# Patient Record
Sex: Male | Born: 1960 | Hispanic: No | Marital: Single | Smoking: Former smoker
Health system: Southern US, Community
[De-identification: ages and names within clinical notes are randomized; demographics above are authoritative.]

## PROBLEM LIST (undated history)

## (undated) DIAGNOSIS — I82409 Acute embolism and thrombosis of unspecified deep veins of unspecified lower extremity: Secondary | ICD-10-CM

## (undated) DIAGNOSIS — C259 Malignant neoplasm of pancreas, unspecified: Secondary | ICD-10-CM

## (undated) HISTORY — PX: HERNIA REPAIR: SHX51

---

## 2014-09-15 ENCOUNTER — Inpatient Hospital Stay: Payer: Medicare Other

## 2014-09-15 ENCOUNTER — Emergency Department: Payer: Medicare Other

## 2014-09-15 ENCOUNTER — Inpatient Hospital Stay
Admission: EM | Admit: 2014-09-15 | Discharge: 2014-09-25 | DRG: 871 | Disposition: E | Payer: Medicare Other | Attending: Internal Medicine | Admitting: Internal Medicine

## 2014-09-15 DIAGNOSIS — C801 Malignant (primary) neoplasm, unspecified: Secondary | ICD-10-CM

## 2014-09-15 DIAGNOSIS — Z9221 Personal history of antineoplastic chemotherapy: Secondary | ICD-10-CM

## 2014-09-15 DIAGNOSIS — Z880 Allergy status to penicillin: Secondary | ICD-10-CM | POA: Diagnosis not present

## 2014-09-15 DIAGNOSIS — N179 Acute kidney failure, unspecified: Secondary | ICD-10-CM | POA: Diagnosis not present

## 2014-09-15 DIAGNOSIS — E663 Overweight: Secondary | ICD-10-CM | POA: Diagnosis present

## 2014-09-15 DIAGNOSIS — R7989 Other specified abnormal findings of blood chemistry: Secondary | ICD-10-CM | POA: Diagnosis not present

## 2014-09-15 DIAGNOSIS — R945 Abnormal results of liver function studies: Secondary | ICD-10-CM

## 2014-09-15 DIAGNOSIS — Z9889 Other specified postprocedural states: Secondary | ICD-10-CM

## 2014-09-15 DIAGNOSIS — J910 Malignant pleural effusion: Secondary | ICD-10-CM | POA: Diagnosis present

## 2014-09-15 DIAGNOSIS — R451 Restlessness and agitation: Secondary | ICD-10-CM | POA: Diagnosis present

## 2014-09-15 DIAGNOSIS — A419 Sepsis, unspecified organism: Secondary | ICD-10-CM | POA: Diagnosis present

## 2014-09-15 DIAGNOSIS — J9601 Acute respiratory failure with hypoxia: Secondary | ICD-10-CM | POA: Diagnosis present

## 2014-09-15 DIAGNOSIS — Z79891 Long term (current) use of opiate analgesic: Secondary | ICD-10-CM | POA: Diagnosis not present

## 2014-09-15 DIAGNOSIS — R34 Anuria and oliguria: Secondary | ICD-10-CM | POA: Diagnosis present

## 2014-09-15 DIAGNOSIS — Z9911 Dependence on respirator [ventilator] status: Secondary | ICD-10-CM

## 2014-09-15 DIAGNOSIS — C259 Malignant neoplasm of pancreas, unspecified: Secondary | ICD-10-CM | POA: Diagnosis present

## 2014-09-15 DIAGNOSIS — Z86718 Personal history of other venous thrombosis and embolism: Secondary | ICD-10-CM | POA: Diagnosis not present

## 2014-09-15 DIAGNOSIS — J90 Pleural effusion, not elsewhere classified: Secondary | ICD-10-CM | POA: Diagnosis present

## 2014-09-15 DIAGNOSIS — R0602 Shortness of breath: Secondary | ICD-10-CM

## 2014-09-15 DIAGNOSIS — Z66 Do not resuscitate: Secondary | ICD-10-CM | POA: Diagnosis not present

## 2014-09-15 DIAGNOSIS — Z87891 Personal history of nicotine dependence: Secondary | ICD-10-CM | POA: Diagnosis not present

## 2014-09-15 DIAGNOSIS — Z6826 Body mass index (BMI) 26.0-26.9, adult: Secondary | ICD-10-CM

## 2014-09-15 DIAGNOSIS — D696 Thrombocytopenia, unspecified: Secondary | ICD-10-CM | POA: Diagnosis present

## 2014-09-15 DIAGNOSIS — R400 Somnolence: Secondary | ICD-10-CM | POA: Diagnosis not present

## 2014-09-15 DIAGNOSIS — E872 Acidosis: Secondary | ICD-10-CM | POA: Diagnosis present

## 2014-09-15 DIAGNOSIS — Z452 Encounter for adjustment and management of vascular access device: Secondary | ICD-10-CM

## 2014-09-15 DIAGNOSIS — R6521 Severe sepsis with septic shock: Secondary | ICD-10-CM | POA: Diagnosis present

## 2014-09-15 DIAGNOSIS — Y95 Nosocomial condition: Secondary | ICD-10-CM | POA: Diagnosis present

## 2014-09-15 DIAGNOSIS — F329 Major depressive disorder, single episode, unspecified: Secondary | ICD-10-CM | POA: Diagnosis present

## 2014-09-15 DIAGNOSIS — M6289 Other specified disorders of muscle: Secondary | ICD-10-CM | POA: Diagnosis present

## 2014-09-15 DIAGNOSIS — Z7401 Bed confinement status: Secondary | ICD-10-CM | POA: Diagnosis not present

## 2014-09-15 DIAGNOSIS — R0600 Dyspnea, unspecified: Secondary | ICD-10-CM | POA: Insufficient documentation

## 2014-09-15 DIAGNOSIS — R0902 Hypoxemia: Secondary | ICD-10-CM | POA: Diagnosis not present

## 2014-09-15 DIAGNOSIS — J189 Pneumonia, unspecified organism: Secondary | ICD-10-CM | POA: Diagnosis present

## 2014-09-15 DIAGNOSIS — K7200 Acute and subacute hepatic failure without coma: Secondary | ICD-10-CM | POA: Diagnosis present

## 2014-09-15 DIAGNOSIS — N170 Acute kidney failure with tubular necrosis: Secondary | ICD-10-CM | POA: Diagnosis present

## 2014-09-15 DIAGNOSIS — J9811 Atelectasis: Secondary | ICD-10-CM | POA: Diagnosis present

## 2014-09-15 DIAGNOSIS — I469 Cardiac arrest, cause unspecified: Secondary | ICD-10-CM | POA: Diagnosis present

## 2014-09-15 DIAGNOSIS — F419 Anxiety disorder, unspecified: Secondary | ICD-10-CM | POA: Diagnosis present

## 2014-09-15 DIAGNOSIS — E875 Hyperkalemia: Secondary | ICD-10-CM | POA: Diagnosis present

## 2014-09-15 DIAGNOSIS — J9600 Acute respiratory failure, unspecified whether with hypoxia or hypercapnia: Secondary | ICD-10-CM

## 2014-09-15 DIAGNOSIS — J9690 Respiratory failure, unspecified, unspecified whether with hypoxia or hypercapnia: Secondary | ICD-10-CM

## 2014-09-15 DIAGNOSIS — Z79899 Other long term (current) drug therapy: Secondary | ICD-10-CM

## 2014-09-15 HISTORY — DX: Acute embolism and thrombosis of unspecified deep veins of unspecified lower extremity: I82.409

## 2014-09-15 HISTORY — DX: Malignant neoplasm of pancreas, unspecified: C25.9

## 2014-09-15 LAB — BLOOD GAS, ARTERIAL
Acid-base deficit: 6.4 mmol/L — ABNORMAL HIGH (ref 0.0–2.0)
Acid-base deficit: 11 mmol/L — ABNORMAL HIGH (ref 0.0–2.0)
BICARBONATE: 19.9 meq/L — AB (ref 21.0–28.0)
Bicarbonate: 18.2 mEq/L — ABNORMAL LOW (ref 21.0–28.0)
DELIVERY SYSTEMS: POSITIVE
EXPIRATORY PAP: 6
FIO2: 0.35
FIO2: 0.6
Inspiratory PAP: 14
LHR: 12 {breaths}/min
MECHANICAL RATE: 14
MECHANICAL RATE: 12
MECHVT: 450 mL
O2 Saturation: 94.3 %
O2 Saturation: 88.9 %
PATIENT TEMPERATURE: 37
PEEP: 5 cmH2O
PH ART: 7.35 (ref 7.350–7.450)
PO2 ART: 78 mmHg — AB (ref 83.0–108.0)
Patient temperature: 37
pCO2 arterial: 33 mmHg (ref 32.0–48.0)
pCO2 arterial: 67 mmHg — ABNORMAL HIGH (ref 32.0–48.0)
pH, Arterial: 7.08 — CL (ref 7.350–7.450)
pO2, Arterial: 76 mmHg — ABNORMAL LOW (ref 83.0–108.0)

## 2014-09-15 LAB — GLUCOSE, SEROUS FLUID: Glucose, Fluid: 34 mg/dL

## 2014-09-15 LAB — GLUCOSE, CAPILLARY
GLUCOSE-CAPILLARY: 165 mg/dL — AB (ref 65–99)
Glucose-Capillary: 116 mg/dL — ABNORMAL HIGH (ref 65–99)
Glucose-Capillary: 126 mg/dL — ABNORMAL HIGH (ref 65–99)

## 2014-09-15 LAB — PROTIME-INR
INR: 1.63
PROTHROMBIN TIME: 19.5 s — AB (ref 11.4–15.0)

## 2014-09-15 LAB — CBC
HEMATOCRIT: 30.9 % — AB (ref 40.0–52.0)
HEMOGLOBIN: 10 g/dL — AB (ref 13.0–18.0)
MCH: 28.7 pg (ref 26.0–34.0)
MCHC: 32.3 g/dL (ref 32.0–36.0)
MCV: 88.9 fL (ref 80.0–100.0)
Platelets: 365 10*3/uL (ref 150–440)
RBC: 3.48 MIL/uL — ABNORMAL LOW (ref 4.40–5.90)
RDW: 20.3 % — ABNORMAL HIGH (ref 11.5–14.5)
WBC: 43.3 10*3/uL — ABNORMAL HIGH (ref 3.8–10.6)

## 2014-09-15 LAB — COMPREHENSIVE METABOLIC PANEL
ALT: 105 U/L — ABNORMAL HIGH (ref 17–63)
ANION GAP: 11 (ref 5–15)
AST: 270 U/L — ABNORMAL HIGH (ref 15–41)
Albumin: 2.3 g/dL — ABNORMAL LOW (ref 3.5–5.0)
Alkaline Phosphatase: 393 U/L — ABNORMAL HIGH (ref 38–126)
BUN: 60 mg/dL — ABNORMAL HIGH (ref 6–20)
CHLORIDE: 103 mmol/L (ref 101–111)
CO2: 20 mmol/L — AB (ref 22–32)
Calcium: 7.8 mg/dL — ABNORMAL LOW (ref 8.9–10.3)
Creatinine, Ser: 1.61 mg/dL — ABNORMAL HIGH (ref 0.61–1.24)
GFR calc non Af Amer: 47 mL/min — ABNORMAL LOW (ref >60)
GFR, EST AFRICAN AMERICAN: 54 mL/min — AB (ref >60)
Glucose, Bld: 144 mg/dL — ABNORMAL HIGH (ref 65–99)
POTASSIUM: 5.2 mmol/L — AB (ref 3.5–5.1)
SODIUM: 134 mmol/L — AB (ref 135–145)
Total Bilirubin: 3.4 mg/dL — ABNORMAL HIGH (ref 0.3–1.2)
Total Protein: 6.5 g/dL (ref 6.5–8.1)

## 2014-09-15 LAB — BODY FLUID CELL COUNT WITH DIFFERENTIAL
EOS FL: 0 %
Lymphs, Fluid: 6 %
Monocyte-Macrophage-Serous Fluid: 8 %
Neutrophil Count, Fluid: 86 %
OTHER CELLS FL: 0 %
Total Nucleated Cell Count, Fluid: 3827 cu mm

## 2014-09-15 LAB — BASIC METABOLIC PANEL
Anion gap: 14 (ref 5–15)
BUN: 57 mg/dL — AB (ref 6–20)
CHLORIDE: 101 mmol/L (ref 101–111)
CO2: 18 mmol/L — AB (ref 22–32)
CREATININE: 1.68 mg/dL — AB (ref 0.61–1.24)
Calcium: 7.8 mg/dL — ABNORMAL LOW (ref 8.9–10.3)
GFR calc Af Amer: 52 mL/min — ABNORMAL LOW (ref >60)
GFR calc non Af Amer: 45 mL/min — ABNORMAL LOW (ref >60)
Glucose, Bld: 118 mg/dL — ABNORMAL HIGH (ref 65–99)
Potassium: 4.9 mmol/L (ref 3.5–5.1)
Sodium: 133 mmol/L — ABNORMAL LOW (ref 135–145)

## 2014-09-15 LAB — LACTATE DEHYDROGENASE, PLEURAL OR PERITONEAL FLUID: LD FL: 4285 U/L — AB (ref 3–23)

## 2014-09-15 LAB — TRIGLYCERIDES
TRIGLYCERIDES: 299 mg/dL — AB (ref <150)
Triglycerides: 321 mg/dL — ABNORMAL HIGH (ref <150)

## 2014-09-15 LAB — APTT: APTT: 33 s (ref 24–36)

## 2014-09-15 LAB — LACTIC ACID, PLASMA: Lactic Acid, Venous: 3.9 mmol/L (ref 0.5–2.0)

## 2014-09-15 LAB — MRSA PCR SCREENING: MRSA BY PCR: NEGATIVE

## 2014-09-15 LAB — ABO/RH: ABO/RH(D): O POS

## 2014-09-15 LAB — HEMOGLOBIN AND HEMATOCRIT, BLOOD
HEMATOCRIT: 31.6 % — AB (ref 40.0–52.0)
Hemoglobin: 9.9 g/dL — ABNORMAL LOW (ref 13.0–18.0)

## 2014-09-15 LAB — PROTEIN, BODY FLUID: TOTAL PROTEIN, FLUID: 4 g/dL

## 2014-09-15 LAB — PREPARE RBC (CROSSMATCH)

## 2014-09-15 MED ORDER — MIDAZOLAM HCL 2 MG/2ML IJ SOLN
4.0000 mg | Freq: Once | INTRAMUSCULAR | Status: AC
  Administered 2014-09-15: 4 mg via INTRAVENOUS

## 2014-09-15 MED ORDER — ACETAMINOPHEN 650 MG RE SUPP: 650.0000 mg | Freq: Four times a day (QID) | RECTAL | Status: DC | PRN

## 2014-09-15 MED ORDER — SODIUM CHLORIDE 0.9 % IV BOLUS (SEPSIS)
1000.0000 mL | Freq: Once | INTRAVENOUS | Status: AC
  Administered 2014-09-15: 1000 mL via INTRAVENOUS

## 2014-09-15 MED ORDER — OXYCODONE HCL 5 MG PO TABS: 15.0000 mg | ORAL_TABLET | ORAL | Status: DC | PRN

## 2014-09-15 MED ORDER — LEVOFLOXACIN IN D5W 750 MG/150ML IV SOLN
750.0000 mg | Freq: Once | INTRAVENOUS | Status: AC
  Administered 2014-09-15: 750 mg via INTRAVENOUS
  Filled 2014-09-15: qty 150

## 2014-09-15 MED ORDER — LORAZEPAM 2 MG PO TABS: 2.0000 mg | ORAL_TABLET | Freq: Three times a day (TID) | ORAL | Status: DC

## 2014-09-15 MED ORDER — SODIUM CHLORIDE 0.9 % IV SOLN
Freq: Once | INTRAVENOUS | Status: AC
  Administered 2014-09-15: 06:00:00 via INTRAVENOUS

## 2014-09-15 MED ORDER — PROPOFOL 1000 MG/100ML IV EMUL
5.0000 ug/kg/min | INTRAVENOUS | Status: DC
  Administered 2014-09-15: 12 ug/kg/min via INTRAVENOUS
  Administered 2014-09-15: 5 ug/kg/min via INTRAVENOUS
  Administered 2014-09-16: 20 ug/kg/min via INTRAVENOUS
  Administered 2014-09-17: 25 ug/kg/min via INTRAVENOUS
  Filled 2014-09-15 (×4): qty 100

## 2014-09-15 MED ORDER — LORAZEPAM 2 MG/ML IJ SOLN
2.0000 mg | Freq: Three times a day (TID) | INTRAMUSCULAR | Status: DC | PRN
  Administered 2014-09-15: 2 mg via INTRAVENOUS
  Filled 2014-09-15: qty 1

## 2014-09-15 MED ORDER — IPRATROPIUM-ALBUTEROL 0.5-2.5 (3) MG/3ML IN SOLN
3.0000 mL | Freq: Four times a day (QID) | RESPIRATORY_TRACT | Status: AC
  Administered 2014-09-15 – 2014-09-17 (×8): 3 mL via RESPIRATORY_TRACT
  Filled 2014-09-15 (×8): qty 3

## 2014-09-15 MED ORDER — SENNOSIDES 8.8 MG/5ML PO SYRP
5.0000 mL | ORAL_SOLUTION | Freq: Two times a day (BID) | ORAL | Status: DC | PRN
  Filled 2014-09-15: qty 5

## 2014-09-15 MED ORDER — FENTANYL CITRATE (PF) 100 MCG/2ML IJ SOLN: 300.0000 ug | Freq: Once | INTRAMUSCULAR | Status: AC

## 2014-09-15 MED ORDER — SODIUM CHLORIDE 0.9 % IV SOLN
INTRAVENOUS | Status: AC
  Administered 2014-09-15: 12:00:00 via INTRAVENOUS

## 2014-09-15 MED ORDER — ONDANSETRON HCL 4 MG/2ML IJ SOLN: 4.0000 mg | Freq: Four times a day (QID) | INTRAMUSCULAR | Status: DC | PRN

## 2014-09-15 MED ORDER — PROPOFOL 1000 MG/100ML IV EMUL
INTRAVENOUS | Status: AC
  Administered 2014-09-15: 5 ug/kg/min via INTRAVENOUS
  Filled 2014-09-15: qty 100

## 2014-09-15 MED ORDER — SERTRALINE HCL 100 MG PO TABS
200.0000 mg | ORAL_TABLET | Freq: Every day | ORAL | Status: DC
  Administered 2014-09-17: 200 mg via ORAL
  Filled 2014-09-15: qty 2

## 2014-09-15 MED ORDER — ACETAMINOPHEN 325 MG PO TABS: 650.0000 mg | ORAL_TABLET | Freq: Four times a day (QID) | ORAL | Status: DC | PRN

## 2014-09-15 MED ORDER — FENTANYL CITRATE (PF) 100 MCG/2ML IJ SOLN
100.0000 ug | INTRAMUSCULAR | Status: DC | PRN
  Administered 2014-09-16 (×3): 100 ug via INTRAVENOUS
  Filled 2014-09-15 (×3): qty 2

## 2014-09-15 MED ORDER — GABAPENTIN 300 MG PO CAPS
300.0000 mg | ORAL_CAPSULE | Freq: Three times a day (TID) | ORAL | Status: DC
  Administered 2014-09-15: 300 mg via ORAL
  Filled 2014-09-15 (×2): qty 1

## 2014-09-15 MED ORDER — FENTANYL CITRATE (PF) 100 MCG/2ML IJ SOLN
100.0000 ug | INTRAMUSCULAR | Status: AC | PRN
  Administered 2014-09-15 (×3): 100 ug via INTRAVENOUS

## 2014-09-15 MED ORDER — ONDANSETRON HCL 4 MG PO TABS: 4.0000 mg | ORAL_TABLET | Freq: Four times a day (QID) | ORAL | Status: DC | PRN

## 2014-09-15 MED ORDER — PANTOPRAZOLE SODIUM 40 MG PO TBEC
40.0000 mg | DELAYED_RELEASE_TABLET | Freq: Every day | ORAL | Status: DC
Start: 2014-09-15 — End: 2014-09-16
  Filled 2014-09-15: qty 1

## 2014-09-15 MED ORDER — VECURONIUM BROMIDE 10 MG IV SOLR
INTRAVENOUS | Status: AC
  Administered 2014-09-15: 10 mg via INTRAVENOUS
  Filled 2014-09-15: qty 10

## 2014-09-15 MED ORDER — LEVOFLOXACIN IN D5W 750 MG/150ML IV SOLN
750.0000 mg | INTRAVENOUS | Status: DC
  Filled 2014-09-15 (×2): qty 150

## 2014-09-15 MED ORDER — VITAL HIGH PROTEIN PO LIQD
1000.0000 mL | ORAL | Status: DC
  Administered 2014-09-15: 1000 mL

## 2014-09-15 MED ORDER — FENTANYL CITRATE (PF) 100 MCG/2ML IJ SOLN
INTRAMUSCULAR | Status: AC
  Administered 2014-09-15: 100 ug via INTRAVENOUS
  Filled 2014-09-15: qty 4

## 2014-09-15 MED ORDER — VANCOMYCIN HCL IN DEXTROSE 1-5 GM/200ML-% IV SOLN
1000.0000 mg | Freq: Once | INTRAVENOUS | Status: AC
  Administered 2014-09-15: 1000 mg via INTRAVENOUS
  Filled 2014-09-15: qty 200

## 2014-09-15 MED ORDER — FENTANYL CITRATE (PF) 100 MCG/2ML IJ SOLN
INTRAMUSCULAR | Status: AC
  Administered 2014-09-15: 100 ug via INTRAVENOUS
  Filled 2014-09-15: qty 2

## 2014-09-15 MED ORDER — FREE WATER
25.0000 mL | Status: DC
  Administered 2014-09-15 – 2014-09-17 (×8): 25 mL

## 2014-09-15 MED ORDER — HEPARIN SODIUM (PORCINE) 5000 UNIT/ML IJ SOLN
5000.0000 [IU] | Freq: Three times a day (TID) | INTRAMUSCULAR | Status: DC
  Administered 2014-09-15 – 2014-09-16 (×2): 5000 [IU] via SUBCUTANEOUS
  Filled 2014-09-15 (×3): qty 1

## 2014-09-15 MED ORDER — VANCOMYCIN HCL IN DEXTROSE 1-5 GM/200ML-% IV SOLN
1000.0000 mg | Freq: Two times a day (BID) | INTRAVENOUS | Status: DC
  Administered 2014-09-15 (×2): 1000 mg via INTRAVENOUS
  Filled 2014-09-15 (×5): qty 200

## 2014-09-15 MED ORDER — MORPHINE SULFATE (PF) 4 MG/ML IV SOLN
4.0000 mg | Freq: Once | INTRAVENOUS | Status: AC
  Administered 2014-09-15: 4 mg via INTRAVENOUS
  Filled 2014-09-15: qty 1

## 2014-09-15 MED ORDER — MIDAZOLAM HCL 2 MG/2ML IJ SOLN
INTRAMUSCULAR | Status: AC
  Administered 2014-09-15: 4 mg via INTRAVENOUS
  Filled 2014-09-15: qty 4

## 2014-09-15 MED ORDER — SENNA 8.6 MG PO TABS: 1.0000 | ORAL_TABLET | Freq: Two times a day (BID) | ORAL | Status: DC | PRN

## 2014-09-15 MED ORDER — VECURONIUM BROMIDE 10 MG IV SOLR
10.0000 mg | Freq: Once | INTRAVENOUS | Status: AC
  Administered 2014-09-15: 10 mg via INTRAVENOUS

## 2014-09-15 MED ORDER — SODIUM CHLORIDE 0.9 % IJ SOLN
3.0000 mL | Freq: Two times a day (BID) | INTRAMUSCULAR | Status: DC
  Administered 2014-09-15 – 2014-09-17 (×5): 3 mL via INTRAVENOUS

## 2014-09-15 NOTE — Progress Notes (Signed)
Update Beside U/S by Radiology showing blood appearance, 1.5L removed.  CTS consulted.  Vilinda Boehringer, MD [facility] Pager [phone] (Please enter 7-digits)

## 2014-09-15 NOTE — Procedures (Signed)
Procedure Note:  Orotracheal Intubation  Implied consent due to emergent nature of patient's condition. Correct Patient, Name & ID confirmed.  The patient was pre-oxygenated and then, under direct visualization, a 8.0 mm cuffed endotracheal tube was placed through the vocal cords into the trachea, using the Glidescope.  Total attempts made 1, using Glidescope.  Thick white secretions noted in posterior pharynx During intubation an assistant applied gentle pressure to the cricoid cartilage.  Position confirmed by auscultation of lungs (good breath sounds bilaterally) and no stomach sounds.  Tube secured at 25 cm. Pulse ox 98 %. CO2 detector in place with appropriate color change.   Pt tolerated procedure well.  No complications were noted.   CXR ordered.   Vilinda Boehringer, MD [facility] Pager [phone] On Call Pager [phone]

## 2014-09-15 NOTE — Progress Notes (Signed)
Oral subglottic suction noted at 146mmhg

## 2014-09-15 NOTE — Progress Notes (Signed)
Patient ID: Steven Pineda, male   DOB: 08/16/1960, 54 y.o.   MRN: [id number]  Chief Complaint  Patient presents with  . Shortness of Breath    Referred By Dr. Rhea Belton Reason for Referral right pleural effusion  HPI Location, Quality, Duration, Severity, Timing, Context, Modifying Factors, Associated Signs and Symptoms.  Steven Pineda is a 54 y.o. male.  I have personally seen and examined this patient. I discussed his care with Dr. Edwin Dada as well as Dr. Truman Hayward at the [facility]. I have independently reviewed his chest x-rays.  The history is obtained from multiple sources including the patient's to answer who I spoke to over the telephone. This patient is a 54 year old gentleman with a history of adenocarcinoma of the pancreas diagnosed approximately 2 years ago. He is undergone chemotherapy at [facility] and recently was admitted to that institution for fever and altered mental status. Blood cultures were negative and the patient ultimately left AGAINST MEDICAL ADVICE. This was about one week prior to admission. I'm told at that time he had a normal chest x-ray. He presented here with increasing shortness of breath and had a chest x-ray showing a very large right pleural effusion. He underwent intubation and subsequent thoracentesis. The thoracentesis was positive for some bloody fluid. Of note is that the patient is currently on Lovenox according to Dr. Truman Hayward. He states that he was being managed for a DVT although that information is still unclear. Dr. Truman Hayward did state that several days ago the patient had a normal-appearing chest x-ray. In any event I've been asked to see the patient for management of his right pleural effusion. I did discuss his care with the patient's 2 aunts who are his primary caregivers. They would like to have a family meeting tomorrow and I will arrange for that.   Past Medical History  Diagnosis Date  . DVT (deep venous thrombosis)   .  Pancreatic cancer     Past Surgical History  Procedure Laterality Date  . Hernia repair      History reviewed. No pertinent family history.  Social History Social History  Substance Use Topics  . Smoking status: Former Research scientist (life sciences)  . Smokeless tobacco: Never Used  . Alcohol Use: None    Allergies  Allergen Reactions  . Penicillins Hives    Current Facility-Administered Medications  Medication Dose Route Frequency Provider Last Rate Last Dose  . 0.9 %  sodium chloride infusion   Intravenous Continuous Juluis Mire, MD 60 mL/hr at 09/05/2014 1200    . acetaminophen (TYLENOL) tablet 650 mg  650 mg Oral Q6H PRN Juluis Mire, MD       Or  . acetaminophen (TYLENOL) suppository 650 mg  650 mg Rectal Q6H PRN Juluis Mire, MD      . feeding supplement (VITAL HIGH PROTEIN) liquid 1,000 mL  1,000 mL Per Tube Q24H Vishal Mungal, MD      . fentaNYL (SUBLIMAZE) injection 100 mcg  100 mcg Intravenous Q2H PRN Vishal Mungal, MD      . free water 25 mL  25 mL Per Tube 6 times per day Vilinda Boehringer, MD      . gabapentin (NEURONTIN) capsule 300 mg  300 mg Oral TID Juluis Mire, MD   300 mg at 08/31/2014 1339  . heparin injection 5,000 Units  5,000 Units Subcutaneous 3 times per day Juluis Mire, MD   5,000 Units at 09/13/2014 1349  . ipratropium-albuterol (DUONEB) 0.5-2.5 (3)  MG/3ML nebulizer solution 3 mL  3 mL Nebulization Q6H Vishal Mungal, MD   3 mL at 09/02/2014 1400  . [START ON 09/16/2014] levofloxacin (LEVAQUIN) IVPB 750 mg  750 mg Intravenous Q24H Epifanio Lesches, MD      . ondansetron (ZOFRAN) tablet 4 mg  4 mg Oral Q6H PRN Juluis Mire, MD       Or  . ondansetron [facility]) injection 4 mg  4 mg Intravenous Q6H PRN Juluis Mire, MD      . oxyCODONE (Oxy IR/ROXICODONE) immediate release tablet 15 mg  15 mg Oral Q4H PRN Juluis Mire, MD      . pantoprazole (PROTONIX) EC tablet 40 mg  40 mg Oral Daily Juluis Mire, MD   40 mg at 09/18/2014 1337  . propofol (DIPRIVAN)  1000 MG/100ML infusion  5-70 mcg/kg/min Intravenous Titrated Vishal Mungal, MD 7.5 mL/hr at 08/25/2014 1500 15 mcg/kg/min at 08/30/2014 1500  . senna (SENOKOT) tablet 8.6 mg  1 tablet Oral BID PRN Epifanio Lesches, MD      . sertraline (ZOLOFT) tablet 200 mg  200 mg Oral Daily Juluis Mire, MD   200 mg at 09/11/2014 1339  . sodium chloride 0.9 % injection 3 mL  3 mL Intravenous Q12H Juluis Mire, MD   3 mL at 09/04/2014 1340  . vancomycin (VANCOCIN) IVPB 1000 mg/200 mL premix  1,000 mg Intravenous Q12H Epifanio Lesches, MD   1,000 mg at 09/19/2014 1348      Review of Systems A complete review of systems was asked and was negative except for the following positive findings.  A review of systems could not be obtained because the patient is currently intubated.  Blood pressure 105/69, pulse 110, temperature 97.7 F (36.5 C), temperature source Axillary, resp. rate 27, height 5\' 10"  (1.778 m), weight 84.7 kg (186 lb 11.7 oz), SpO2 93 %.  Physical Exam CONSTITUTIONAL:  Pleasant, well-developed, well-nourished.  He is currently intubated. EYES: Pupils equal and reactive to light, Sclera non-icteric EARS, NOSE, MOUTH AND THROAT:  The oropharynx was clear.  Oral mucosa pink and moist. LYMPH NODES:  Lymph nodes in the neck and axillae were normal RESPIRATORY:  Lungs were clear on the left and diminished on the right..  Normal respiratory effort without pathologic use of accessory muscles of respiration  CARDIOVASCULAR: Heart was regular without murmurs.  There were no carotid bruits. GI: The abdomen was soft, nontender, and nondistended. There were no palpable masses. There was no hepatosplenomegaly. There were normal bowel sounds in all quadrants.  There were no obvious scars present. GU:  Rectal deferred.   MUSCULOSKELETAL:  Normal muscle strength and tone.  No clubbing or cyanosis.   SKIN:  There were no pathologic skin lesions.  There were no nodules on palpation. NEUROLOGIC:  Sensation is  normal.  Cranial nerves are grossly intact. PSYCH:  Oriented to person, place and time.  Mood and affect are normal.  Data Reviewed I have reviewed the patient's chest x-rays.  I have personally reviewed the patient's imaging, laboratory findings and medical records.    Assessment    I believe this patient most likely has a malignant pleural effusion although with a normal chest x-ray from several days ago it would appear that this is perhaps less likely. The patient is on Lovenox and it's unclear whether or not there may have been any local trauma. His post procedure chest x-ray shows a marked emanation in the size of the pleural effusion. However  there still remains a moderate sized pleural effusion but there is no evidence of pneumothorax.    Plan    At the present time I will await the results of the cytology on the pleural fluid. I will meet with the family tomorrow. My current recommendation is to proceed with a thoracoscopic talc pleurodesis and a Pleurx catheter placement. If the patient can get extubated they are willing to except him at [facility] for ongoing oncologic care.   Nestor Lewandowsky, MD 09/16/2014, 4:36 PM

## 2014-09-15 NOTE — Consult Note (Signed)
 [place]  [phone] [phone]  ID: Steven Pineda OB: Jul 22, 1960  MR#: [id number]  ZTI#:[id number]  No care team member to display  CHIEF COMPLAINT:  Chief Complaint  Patient presents with  . Shortness of Breath    INTERVAL HISTORY: Patient is a 54 year old gentleman with reported history of adenocarcinoma the pancreas. He has been undergoing chemotherapy at [facility], it is unclear when his last treatment was. He recently was admitted to [facility] with altered mental status and fever but subsequently left AMA. He presented to [facility] with increasing shortness of breath and a new right pleural effusion. He was intubated and sedated and underwent thoracentesis to assess whether his pleural effusion is malignant. No family is at bedside. Review of systems is unobtainable.   REVIEW OF SYSTEMS:   Review of Systems  Unable to perform ROS: intubated    As per HPI. Otherwise, a complete review of systems is negatve.  PAST MEDICAL HISTORY: Past Medical History  Diagnosis Date  . DVT (deep venous thrombosis)   . Pancreatic cancer     PAST SURGICAL HISTORY: Past Surgical History  Procedure Laterality Date  . Hernia repair      FAMILY HISTORY History reviewed. No pertinent family history.     ADVANCED DIRECTIVES:    HEALTH MAINTENANCE: Social History  Substance Use Topics  . Smoking status: Former Research scientist (life sciences)  . Smokeless tobacco: Never Used  . Alcohol Use: None     Colonoscopy:  PAP:  Bone density:  Lipid panel:  Allergies  Allergen Reactions  . Penicillins Hives    Current Facility-Administered Medications  Medication Dose Route Frequency Provider Last Rate Last Dose  . 0.9 %  sodium chloride infusion   Intravenous Continuous Juluis Mire, MD 60 mL/hr at 09/20/2014 1200    . acetaminophen (TYLENOL) tablet 650 mg  650 mg Oral Q6H PRN Juluis Mire, MD       Or  . acetaminophen (TYLENOL) suppository 650 mg  650 mg Rectal Q6H  PRN Juluis Mire, MD      . feeding supplement (VITAL HIGH PROTEIN) liquid 1,000 mL  1,000 mL Per Tube Q24H Vishal Mungal, MD      . fentaNYL (SUBLIMAZE) injection 100 mcg  100 mcg Intravenous Q2H PRN Vishal Mungal, MD      . free water 25 mL  25 mL Per Tube 6 times per day Vilinda Boehringer, MD      . gabapentin (NEURONTIN) capsule 300 mg  300 mg Oral TID Juluis Mire, MD   300 mg at 09/19/2014 1339  . heparin injection 5,000 Units  5,000 Units Subcutaneous 3 times per day Juluis Mire, MD   5,000 Units at 09/11/2014 1349  . ipratropium-albuterol (DUONEB) 0.5-2.5 (3) MG/3ML nebulizer solution 3 mL  3 mL Nebulization Q6H Vishal Mungal, MD   3 mL at 09/12/2014 1400  . [START ON 09/16/2014] levofloxacin (LEVAQUIN) IVPB 750 mg  750 mg Intravenous Q24H Epifanio Lesches, MD      . ondansetron (ZOFRAN) tablet 4 mg  4 mg Oral Q6H PRN Juluis Mire, MD       Or  . ondansetron [facility]) injection 4 mg  4 mg Intravenous Q6H PRN Juluis Mire, MD      . oxyCODONE (Oxy IR/ROXICODONE) immediate release tablet 15 mg  15 mg Oral Q4H PRN Juluis Mire, MD      . pantoprazole (PROTONIX) EC tablet 40 mg  40 mg Oral Daily Juluis Mire,  MD   40 mg at 09/06/2014 1337  . propofol (DIPRIVAN) 1000 MG/100ML infusion  5-70 mcg/kg/min Intravenous Titrated Vishal Mungal, MD 7.5 mL/hr at 09/01/2014 1500 15 mcg/kg/min at 09/08/2014 1500  . senna (SENOKOT) tablet 8.6 mg  1 tablet Oral BID PRN Epifanio Lesches, MD      . sertraline (ZOLOFT) tablet 200 mg  200 mg Oral Daily Juluis Mire, MD   200 mg at 09/08/2014 1339  . sodium chloride 0.9 % injection 3 mL  3 mL Intravenous Q12H Juluis Mire, MD   3 mL at 09/14/2014 1340  . vancomycin (VANCOCIN) IVPB 1000 mg/200 mL premix  1,000 mg Intravenous Q12H Epifanio Lesches, MD   1,000 mg at 09/08/2014 1348    OBJECTIVE: Filed Vitals:   09/14/2014 1500  BP: 105/69  Pulse: 110  Temp: 97.7 F (36.5 C)  Resp: 27     Body mass index is 26.79 kg/(m^2).    ECOG FS:4 -  Bedbound  General: Intubated. HEENT: ET tube in place. Lungs: Diminished breath sounds on right, mechanical breath sounds.  Heart: Regular rate and rhythm. No rubs, murmurs, or gallops. Abdomen: Soft, nontender, nondistended. No organomegaly noted, normoactive bowel sounds. Musculoskeletal: No edema, cyanosis, or clubbing. Neuro: Intubated and sedated. Skin: No rashes or petechiae noted. Psych: Normal affect.   LAB RESULTS:  Lab Results  Component Value Date   NA 134* 09/19/2014   K 5.2* 08/29/2014   CL 103 09/09/2014   CO2 20* 08/31/2014   GLUCOSE 144* 09/03/2014   BUN 60* 08/30/2014   CREATININE 1.61* 08/29/2014   CALCIUM 7.8* 08/29/2014   PROT 6.5 09/10/2014   ALBUMIN 2.3* 08/25/2014   AST 270* 09/04/2014   ALT 105* 09/24/2014   ALKPHOS 393* 09/07/2014   BILITOT 3.4* 09/09/2014   GFRNONAA 47* 09/24/2014   GFRAA 54*     Lab Results  Component Value Date   WBC 43.3* 09/18/2014   HGB 10.0* 09/10/2014   HCT 30.9* 09/07/2014   MCV 88.9 08/30/2014   PLT 365 08/28/2014     STUDIES: Dg Abd 1 View  09/05/2014   CLINICAL DATA:  Enteric catheter placement.  Status post intubation.  EXAM: ABDOMEN - 1 VIEW  COMPARISON:  None.  FINDINGS: Limited view of the abdomen demonstrates enteric catheter overlying the expected location of gastric body, with side port 5 cm inferior to the expected location of gastro esophageal junction. Bowel gas pattern, where visualized, is nonobstructive. Again seen is complete opacification of the right hemithorax.  IMPRESSION: Enteric catheter placement, side port slightly inferior to the expected location of gastrointestinal junction.   Electronically Signed   By: Fidela Salisbury M.D.   On: 09/21/2014 13:12   Dg Chest Port 1 View  09/10/2014   CLINICAL DATA:  Right pleural effusion, status post right thoracentesis.  EXAM: PORTABLE CHEST - 1 VIEW  COMPARISON:  08/28/2014.  FINDINGS: A right pleural effusion has decreased in size. There  appears to be a small to moderate right effusion present.  Right lower lung atelectasis is identified.  The cardiomediastinal silhouette is unchanged.  An endotracheal tube with tip 3.2 cm above the carina, NG tube entering the stomach with tip off the field of view and right IJ Port-A-Cath with tip overlying the superior cavoatrial junction again noted. There is no evidence of pneumothorax.  IMPRESSION: Decreased right pleural effusion status post right thoracentesis. No evidence of pneumothorax.  Right lower lung atelectasis and support apparatus as described.   Electronically Signed  By: Margarette Canada M.D.   On: 09/07/2014 15:47   Dg Chest Port 1 View  09/05/2014   CLINICAL DATA:  Right posttherapy failure. Status post intubation and line placement.  EXAM: PORTABLE CHEST - 1 VIEW  COMPARISON:    FINDINGS: There has been interval intubation with the endotracheal tube overlying the tracheal air column and terminating 3.3 cm superior to the carina. Right-sided central venous catheter has been placed, tip overlying the expected location of cavoatrial junction. Enteric catheter transverses the thorax, tip collimated off the image.  Cardiomediastinal silhouette and mediastinal contours are partially obscured by complete opacification of the right hemithorax.  The left lung is clear.  There is no evidence of pneumothorax.  Osseous structures are without acute abnormality. Soft tissues are grossly normal.  IMPRESSION: Status post intubation, endotracheal tube 3.3 cm superior to the carina.  New complete opacification of the right hemithorax, slightly worse than on the prior radiograph.   Electronically Signed   By: Fidela Salisbury M.D.   On: 08/27/2014 13:09   Dg Chest Port 1 View  09/20/2014   CLINICAL DATA:  Shortness of breath. Recent treatment for infection. Decreased oxygen saturation.  EXAM: PORTABLE CHEST - 1 VIEW  COMPARISON:  None.  FINDINGS: Near complete white out of the right hemi thorax  with aeration only of the right apex. This may indicate a large pleural effusion and/or consolidation or atelectasis on the right. Left lung is clear. Heart size as visualized appears normal. No pneumothorax. Right-sided Infuse-A-Port with tip over the low SVC region.  IMPRESSION: Near complete white out of the right hemi thorax consistent with large pleural effusion and/or consolidation or atelectasis.   Electronically Signed   By: Lucienne Capers M.D.   On: 09/11/2014 03:18   US Thoracentesis Asp Pleural Space W/img Guide  09/07/2014   INDICATION: History of stage IV pancreatic cancer, now with large symptomatic right-sided pleural effusion. Please perform ultrasound-guided right-sided thoracentesis for diagnostic and therapeutic purposes.  EXAM: US THORACENTESIS ASP PLEURAL SPACE W/IMG GUIDE  COMPARISON:  Chest radiograph -09/13/2014  MEDICATIONS: None  COMPLICATIONS: None immediate  TECHNIQUE: Informed written consent was obtained from the patient's family after a discussion of the risks, benefits and alternatives to treatment. A timeout was performed prior to the initiation of the procedure.  Initial ultrasound scanning demonstrates a large right-sided pleural effusion which is noted to contain multiple internal low level echoes. The antero lateral aspect of the right lower chest was prepped and draped in the usual sterile fashion. 1% lidocaine was used for local anesthesia. An ultrasound image was saved for documentation purposes. An 8 Fr Safe-T-Centesis catheter was introduced. The thoracentesis was performed. The catheter was removed and a dressing was applied. The patient tolerated the procedure well without immediate post procedural complication.  FINDINGS: A total of approximately 1.8 liters of blood tinged serous fluid was removed. Requested samples were sent to the laboratory.  As this was the patient's first thoracentesis, the thoracentesis was stopped following the acquisition of 1.8 L of fluid  though additional fluid remains.  IMPRESSION: Successful ultrasound-guided right sided thoracentesis yielding 1.8 liters of blood tinged serous pleural fluid.   Electronically Signed   By: Sandi Mariscal M.D.   On: 09/14/2014 16:05    ASSESSMENT: Pancreatic cancer, now intubated secondary to pleural effusion.  PLAN:    1. Pancreatic cancer: Unclear of extent of patient's disease or treatment he is receiving. Awaiting records from [facility]. Once patient is stable for transfer, he  can have continued oncology care with his primary oncologist at [facility]. If patient is extubated and discharged, please arrange close follow-up at [facility]. 2. Pleural effusion: Highly suspicious for malignant effusion. Thoracentesis earlier today, cytology results are pending.  Appreciate consult, will follow.   Lloyd Huger, MD   09/03/2014 5:54 PM

## 2014-09-15 NOTE — Progress Notes (Signed)
   08/26/2014 1408  Adult Ventilator Settings  Set Rate 20 bmp  increased RR to 20 per md request

## 2014-09-15 NOTE — Care Management Note (Signed)
Case Management Note  Patient Details  Name: Steven Pineda MRN: [id number] Date of Birth: 17-Aug-1960  Subjective/Objective:   9 YOWM with stage IV pancreatic Ca. Admitted with WBC 43.3 and right pleural effusion.   Action/Plan: Currently having increased work of breathing with no improvement on bipap. Plan is intubation. Patient is care for at home by his 2 aunts. They are in route to [facility], CM will follow up                 Expected Discharge Date:                  Expected Discharge Plan:     In-House Referral:     Discharge planning Services     Post Acute Care Choice:    Choice offered to:     DME Arranged:    DME Agency:     HH Arranged:    [place] Agency:     Status of Service:     Medicare Important Message Given:    Date Medicare IM Given:    Medicare IM give by:    Date Additional Medicare IM Given:    Additional Medicare Important Message give by:     If discussed at [place] of Stay Meetings, dates discussed:    Additional Comments:  Jolly Mango, RN 09/14/2014, 11:25 AM

## 2014-09-15 NOTE — Progress Notes (Signed)
PULMONARY / CRITICAL CARE MEDICINE   Name: Steven Pineda MRN: [id number] DOB: [birth year]    ADMISSION DATE:  09/18/2014 CONSULTATION DATE:  09/14/2014  REFERRING MD :  Dr. Reece Levy   CHIEF COMPLAINT:     Short of breath and confusion   HISTORY OF PRESENT ILLNESS    54 year old male past medical history of stage IV pancreatic cancer, history of DVT, recent admission at [facility] 10 days ago for a suspected "infection", sign out AMA, admitted for worsening shortness of breath with hypoxia and worsening confusion over the past 2-3 days. Patient is noted to be altered and confused on BiPAP at the time of evaluation. History obtained from next of kin who is an aunt, who stated that patient has a history of stage IV pancreatic cancer, sign out AMA from [facility] as stated above against the family wishes. He's been having worsening confusion over the last 2-3 days along with labored breathing, and which point EMS was called as stated above. Per arrival to the ICU is noted to be on BiPAP, with severe agitation, received 2 mg of Ativan, then became somnolent, and is becoming more awake however with moderate to severe confusion and altered mental status, his ABG showed mild metabolic acidosis. Chest x-ray with significant pleural effusion and white count, upon my evaluation patient's noted to be moderately overweight but severely confused, slurred speech, increase use of accessory muscles. I have spoken to his aunt, Steven Pineda, who stated that he is a full code.       PAST MEDICAL HISTORY    :  Past Medical History  Diagnosis Date  . DVT (deep venous thrombosis)   . Pancreatic cancer    Past Surgical History  Procedure Laterality Date  . Hernia repair     Prior to Admission medications   Medication Sig Start Date End Date Taking? Authorizing Provider  ciprofloxacin (CIPRO) 500 MG tablet Take 1 tablet by mouth 2 (two) times daily. 09/01/14  Yes Historical Provider, MD  gabapentin (NEURONTIN) 300 MG  capsule Take 1 capsule by mouth 3 (three) times daily. 08/13/14  Yes Historical Provider, MD  LORazepam (ATIVAN) 2 MG tablet Take 1 tablet by mouth 3 (three) times daily. 08/18/14  Yes Historical Provider, MD  oxyCODONE (ROXICODONE) 15 MG immediate release tablet Take 1 tablet by mouth every 4 (four) hours as needed. 08/29/14  Yes Historical Provider, MD  sertraline (ZOLOFT) 100 MG tablet Take 2 tablets by mouth daily. 08/18/14  Yes Historical Provider, MD   Allergies  Allergen Reactions  . Penicillins Hives     FAMILY HISTORY   History reviewed. No pertinent family history.    SOCIAL HISTORY    reports that he has quit smoking. He has never used smokeless tobacco. He reports that he does not use illicit drugs. His alcohol history is not on file.  ROS difficult to obtain, patient with altered mental status and on BiPAP    VITAL SIGNS    Temp:  [97.5 F (36.4 C)] 97.5 F (36.4 C) (08/22 0958) Pulse Rate:  [110-125] 114 (08/22 1000) Resp:  [12-32] 20 (08/22 1000) BP: (96-129)/(60-96) 120/68 mmHg (08/22 1000) SpO2:  [90 %-99 %] 94 % (08/22 1000) FiO2 (%):  [35 %] 35 % (08/22 1000) Weight:  [184 lb 8 oz (83.689 kg)] 184 lb 8 oz (83.689 kg) (08/22 0251) HEMODYNAMICS:   VENTILATOR SETTINGS: Vent Mode:  [-]  FiO2 (%):  [35 %] 35 % INTAKE / OUTPUT: No intake or output data in the  24 hours ending 09/19/2014 1132     PHYSICAL EXAM   Physical Exam Constitutional: Acute respiratory distress, confused, on BiPAP HENT:  Head: Normocephalic and atraumatic.  Right Ear: External ear normal.  Left Ear: External ear normal.  Nose: Nose normal.  Mouth/Throat: Oropharynx is clear and moist. No oropharyngeal exudate.  Wearing BiPAP mask  Eyes: EOM are normal. Pupils are equal, round, and reactive to light. No scleral icterus.  Neck: Normal range of motion. Neck supple. No JVD present. No thyromegaly present.  Cardiovascular: Normal rate, regular rhythm, normal heart sounds and  intact distal pulses. Exam reveals no friction rub.  No murmur heard. Respiratory: Effort normal. He exhibits no tenderness.   severely Diminished air entry on the right side  Positive use of respiratory  accessory muscles GI: Bowel sounds are normal.  Musculoskeletal: Normal range of motion. He exhibits no edema.  Lymphadenopathy:   He has no cervical adenopathy.  Neurological: He is alert. He has normal reflexes. He displays normal reflexes. No cranial nerve deficit. He exhibits normal muscle tone.     LABS   LABS:  CBC  Recent Labs Lab 09/14/2014 0249  WBC 43.3*  HGB 10.0*  HCT 30.9*  PLT 365   Coag's No results for input(s): APTT, INR in the last 168 hours. BMET  Recent Labs Lab 08/29/2014 0249  NA 133*  K 4.9  CL 101  CO2 18*  BUN 57*  CREATININE 1.68*  GLUCOSE 118*   Electrolytes  Recent Labs Lab 09/23/2014 0249  CALCIUM 7.8*   Sepsis Markers  Recent Labs Lab 09/13/2014 0249  LATICACIDVEN 3.9*   ABG  Recent Labs Lab 09/10/2014 0247  PHART 7.35  PCO2ART 33  PO2ART 76*   Liver Enzymes No results for input(s): AST, ALT, ALKPHOS, BILITOT, ALBUMIN in the last 168 hours. Cardiac Enzymes No results for input(s): TROPONINI, PROBNP in the last 168 hours. Glucose No results for input(s): GLUCAP in the last 168 hours.   Recent Results (from the past 240 hour(s))  Blood culture (routine x 2)     Status: None (Preliminary result)   Collection Time: 09/18/2014  4:08 AM  Result Value Ref Range Status   Specimen Description BLOOD LEFT ARM  Final   Special Requests BOTTLES DRAWN AEROBIC AND ANAEROBIC 5CC  Final   Culture NO GROWTH < 12 HOURS  Final   Report Status PENDING  Incomplete  Blood culture (routine x 2)     Status: None (Preliminary result)   Collection Time: 09/07/2014  4:08 AM  Result Value Ref Range Status   Specimen Description BLOOD RIGHT  Final   Special Requests BOTTLES DRAWN AEROBIC AND ANAEROBIC 5CC  Final   Culture NO GROWTH < 12  HOURS  Final   Report Status PENDING  Incomplete     Current facility-administered medications:  .  0.9 %  sodium chloride infusion, , Intravenous, Continuous, Juluis Mire, MD .  acetaminophen (TYLENOL) tablet 650 mg, 650 mg, Oral, Q6H PRN **OR** acetaminophen (TYLENOL) suppository 650 mg, 650 mg, Rectal, Q6H PRN, Juluis Mire, MD .  fentaNYL (SUBLIMAZE) 100 MCG/2ML injection, , , ,  .  gabapentin (NEURONTIN) capsule 300 mg, 300 mg, Oral, TID, Juluis Mire, MD .  heparin injection 5,000 Units, 5,000 Units, Subcutaneous, 3 times per day, Juluis Mire, MD .  Derrill Memo ON 09/16/2014] levofloxacin (LEVAQUIN) IVPB 750 mg, 750 mg, Intravenous, Q24H, Epifanio Lesches, MD .  midazolam (VERSED) 2 MG/2ML injection, , , ,  .  ondansetron (ZOFRAN) tablet 4 mg, 4 mg, Oral, Q6H PRN **OR** ondansetron (ZOFRAN) injection 4 mg, 4 mg, Intravenous, Q6H PRN, Juluis Mire, MD .  oxyCODONE (Oxy IR/ROXICODONE) immediate release tablet 15 mg, 15 mg, Oral, Q4H PRN, Juluis Mire, MD .  pantoprazole (PROTONIX) EC tablet 40 mg, 40 mg, Oral, Daily, Juluis Mire, MD .  sertraline (ZOLOFT) tablet 200 mg, 200 mg, Oral, Daily, Juluis Mire, MD .  sodium chloride 0.9 % injection 3 mL, 3 mL, Intravenous, Q12H, Juluis Mire, MD .  vancomycin (VANCOCIN) IVPB 1000 mg/200 mL premix, 1,000 mg, Intravenous, Q12H, Epifanio Lesches, MD  IMAGING    Dg Chest Port 1 View  09/05/2014   CLINICAL DATA:  Shortness of breath. Recent treatment for infection. Decreased oxygen saturation.  EXAM: PORTABLE CHEST - 1 VIEW  COMPARISON:  None.  FINDINGS: Near complete white out of the right hemi thorax with aeration only of the right apex. This may indicate a large pleural effusion and/or consolidation or atelectasis on the right. Left lung is clear. Heart size as visualized appears normal. No pneumothorax. Right-sided Infuse-A-Port with tip over the low SVC region.  IMPRESSION: Near complete white out of the  right hemi thorax consistent with large pleural effusion and/or consolidation or atelectasis.   Electronically Signed   By: Lucienne Capers M.D.   On: 09/20/2014 03:18      Indwelling Urinary Catheter continued, requirement due to   Reason to continue Indwelling Urinary Catheter for strict Intake/Output monitoring for hemodynamic instability   Central Line continued, requirement due to   Reason to continue [employer] Monitoring of central venous pressure or other hemodynamic parameters   Ventilator continued, requirement due to, resp failure    Ventilator Sedation RASS 0 to -2      ASSESSMENT/PLAN  54 year old male past medical history of stage IV pancreatic cancer, followed at [facility], history of DVT, now with significant right-sided pleural effusion, respiratory distress, unlabored breathing.    PULMONARY  A: Acute hypoxic respiratory distress Right-sided pleural effusion ??RLL PNA P:   Secondary to right-sided pleural effusion, muscle fatigue - plan for intubation and mechanical ventilation Given his labored breathing and use of accessory muscles or plan for intubation and ventilatory support Ultrasound-guided thoracentesis for right-sided pleural effusion, sent studies for cytology, microbiology, fungal, AFB, viral workup ? Malignant effusion - f/u thoracentesis studies Mechanical ventilation wean as tolerated, daily SBT, daily sedation vacation  CARDIOVASCULAR Hemodynamics stable  RENAL A:Elevated creatinine P: Unknown baseline creatinine at this time Possible dehydration Gentle IV fluids Electrolyte ICU protocol replacement  GASTROINTESTINAL Tube feeds while on ventilator   HEMATOLOGIC A: Stage IV pancreatic cancer leukocytosis P: Monitor H&H and CBC Hematology oncology consult   INFECTIOUS A: sepsis P:   Possible secondary to RLL PNA or infected pleural effusion Cont with current abx (vanc and levaquin) Pleural effusion tap   Micro Bld Cx  8/22 Sputum Cx Urine Cx Pleural Cx 8/22  ABx Vanc 8/22>> Levaquin 8/22>>   ENDOCRINE ICU Hypoglycemia/hyperglycemia protocol  NEUROLOGIC A: confused P:   Secondary to sepsis RASS goal: 0 to -1 while on MV   Social: FULL CODE - spoke with POA (Aunt, Steven Pineda), updated her on patient clinical status and management plan.   I have personally obtained a history, examined the patient, evaluated laboratory and imaging results, formulated the assessment and plan and placed orders.  The Patient requires high complexity decision making for assessment and support, frequent evaluation and titration of therapies,  application of advanced monitoring technologies and extensive interpretation of multiple databases. Critical Care Time devoted to patient care services described in this note is 45 minutes.   Overall, patient is critically ill, prognosis is guarded. Patient at high risk for cardiac arrest and death.   Vilinda Boehringer, MD [facility] Pager [phone] (Please enter [phone]-[phone])   09/06/2014, 11:32 AM

## 2014-09-15 NOTE — Progress Notes (Signed)
 [name] [place] NAME: Steven Pineda    MR#:  [id number]  DATE OF BIRTH:  Dec 03, 1960  SUBJECTIVE:  CHIEF COMPLAINT:   Chief Complaint  Patient presents with  . Shortness of Breath    REVIEW OF SYSTEMS:   ROS CONSTITUTIONAL: No fever, fatigue or weakness.  EYES: No blurred or double vision.  EARS, NOSE, AND THROAT: No tinnitus or ear pain.  RESPIRATORY: No cough, shortness of breath, wheezing or hemoptysis.  CARDIOVASCULAR: No chest pain, orthopnea, edema.  GASTROINTESTINAL: No nausea, vomiting, diarrhea or abdominal pain.  GENITOURINARY: No dysuria, hematuria.  ENDOCRINE: No polyuria, nocturia,  HEMATOLOGY: No anemia, easy bruising or bleeding SKIN: No rash or lesion. MUSCULOSKELETAL: No joint pain or arthritis.   NEUROLOGIC: No tingling, numbness, weakness.  PSYCHIATRY: No anxiety or depression.   DRUG ALLERGIES:   Allergies  Allergen Reactions  . Penicillins Hives    VITALS:  Blood pressure 146/86, pulse 119, temperature 97.5 F (36.4 C), resp. rate 17, height 5\' 10"  (1.778 m), weight 83.689 kg (184 lb 8 oz), SpO2 92 %.  PHYSICAL EXAMINATION:  GENERAL:  54 y.o.-year-old patient lying in the bed with no acute distress.  EYES: Pupils equal, round, reactive to light and accommodation. No scleral icterus. Extraocular muscles intact.  HEENT: Head atraumatic, normocephalic. Oropharynx and nasopharynx clear.  NECK:  Supple, no jugular venous distention. No thyroid enlargement, no tenderness.  LUNGS: Normal breath sounds bilaterally, no wheezing, rales,rhonchi or crepitation. No use of accessory muscles of respiration.  CARDIOVASCULAR: S1, S2 normal. No murmurs, rubs, or gallops.  ABDOMEN: Soft, nontender, nondistended. Bowel sounds present. No organomegaly or mass.  EXTREMITIES: No pedal edema, cyanosis, or clubbing.  NEUROLOGIC: Cranial nerves II through XII are intact. Muscle strength 5/5 in all extremities.  Sensation intact. Gait not checked.  PSYCHIATRIC: The patient is alert and oriented x 3.  SKIN: No obvious rash, lesion, or ulcer.    LABORATORY PANEL:   CBC  Recent Labs Lab 09/12/2014 0249  WBC 43.3*  HGB 10.0*  HCT 30.9*  PLT 365   ------------------------------------------------------------------------------------------------------------------  Chemistries   Recent Labs Lab 09/08/2014 0249  NA 133*  K 4.9  CL 101  CO2 18*  GLUCOSE 118*  BUN 57*  CREATININE 1.68*  CALCIUM 7.8*   ------------------------------------------------------------------------------------------------------------------  Cardiac Enzymes No results for input(s): TROPONINI in the last 168 hours. ------------------------------------------------------------------------------------------------------------------  RADIOLOGY:  Dg Chest Port 1 View  09/09/2014   CLINICAL DATA:  Shortness of breath. Recent treatment for infection. Decreased oxygen saturation.  EXAM: PORTABLE CHEST - 1 VIEW  COMPARISON:  None.  FINDINGS: Near complete white out of the right hemi thorax with aeration only of the right apex. This may indicate a large pleural effusion and/or consolidation or atelectasis on the right. Left lung is clear. Heart size as visualized appears normal. No pneumothorax. Right-sided Infuse-A-Port with tip over the low SVC region.  IMPRESSION: Near complete white out of the right hemi thorax consistent with large pleural effusion and/or consolidation or atelectasis.   Electronically Signed   By: Lucienne Capers M.D.   On: 09/05/2014 03:18    EKG:   Orders placed or performed during the hospital encounter of 09/11/2014  . ED EKG  . ED EKG  . EKG 12-Lead  . EKG 12-Lead    ASSESSMENT AND PLAN:   Acute respiratory failure  Due to Right pleural effusion ,;s/p emergent intubation;continue full vent support, nebulizers,appeciate DR,Mungal input. 2. Stage 4 pancreatic  cancer oncology consult, palliative care  consult, patient may have malignant pleural effusion. Prognosis is poor, #3 History of anxiety and agitation patient did receive Ativan this morning.  #4. sepsis due to pneumonia wbc still high, speosos  with elevated lactic acid and white count up to 43.;continue on Cleocin, Levaquin. I don't see any records in Epic if he was at [facility] 10 days ago. We will try to obtain records from [facility] H/o DVT' ontinue l ovenox,..   All the records are reviewed and case discussed with Care Management/Social Workerr. Management plans discussed with the patient, family and they are in agreement.  CODE STATUS: full  TOTAL TIME TAKING CARE OF THIS PATIENT: 35  minutes.  Critical care time.  Condition critical, prognosis is poor.   Epifanio Lesches M.D on  at 12:20 PM  Between 7am to 6pm - Pager - [phone]  After 6pm go to [email or web address] - password [email or web address] [facility]  [facility]  Office  [phone]  CC: Primary care physician; No primary care provider on file.

## 2014-09-15 NOTE — ED Notes (Signed)
According to EMS, pt c/o dyspnea and sob, and has recently been treated for an undisclosed infection. EMS states upon arrival pts O2 saturation 82%, EMS administered x2 Duonebs.

## 2014-09-15 NOTE — Procedures (Signed)
Successful US guided right sided thoracentesis with 1.8 L bloody serous pleural fluid aspirated.   Samples sent to lab for analysis. No immediate complications.  Ronny Bacon, MD Pager #: [phone]

## 2014-09-15 NOTE — Progress Notes (Signed)
Initial Nutrition Assessment     INTERVENTION:   EN: recommend starting Vital High Protein at rate of 20 ml/hr, current goal of 60 ml/hr providing 1440 kcals, 127 g of protein, 1210 mL of free water. Will reassess goal rate daily based on additional calories provided from diprivan. Recommend starting free water flush of 25 mL q 4 hours, pt currently on additional IVF. Adult Tube Feeding Protocol ordered  NUTRITION DIAGNOSIS:   Inadequate oral intake related to acute illness as evidenced by NPO status.  GOAL:   Provide needs based on ASPEN/SCCM guidelines  MONITOR:    (Energy Intake, EN, Digestive System, Electrolyte/Renal Profile, Anthropometrics)  REASON FOR ASSESSMENT:   Consult Enteral/tube feeding initiation and management  ASSESSMENT:    Pt admitted with acute respiratory failure with pneumonia and right sided pleural effusion, possible malignant effusion. Pt with AMS, worsening status on Bipap requiring intubation. Pt with stage IV pancreatic cancer receiving chemo  Past Medical History  Diagnosis Date  . DVT (deep venous thrombosis)   . Pancreatic cancer     Diet Order:   NPO  Digestive System: OG tube, abdomen soft, BS hypoactive, no BM documented  Skin:  Reviewed, no issues  Electrolyte/Renal and Glucose Profile  Recent Labs Lab 08/26/2014 0249  NA 133*  K 4.9  CL 101  CO2 18*  BUN 57*  CREATININE 1.68*  CALCIUM 7.8*  GLUCOSE 118*   Nutritional Anemia Profile:  CBC Latest Ref Rng 09/21/2014  WBC 3.8 - 10.6 K/uL 43.3(H)  Hemoglobin 13.0 - 18.0 g/dL 10.0(L)  Hematocrit 40.0 - 52.0 % 30.9(L)  Platelets 150 - 440 K/uL 365   Meds: diprivan, NS at 60 ml/hr, fentanyl, versed, senna  Height:   Ht Readings from Last 1 Encounters:  09/11/2014 5\' 10"  (1.778 m)    Weight:   Wt Readings from Last 1 Encounters:  08/28/2014 184 lb 8 oz (83.689 kg)    IBMI:  Body mass index is 26.47 kg/(m^2).  Estimated Nutritional Needs:   Kcal:  1677 kcals (Ve; 5.5,  Tmax: 36.4) using wt of 83.7 kg  Protein:  126-168 g (1.5-2.0 g/kg)   Fluid:  2100-2520 mL (25-30 ml/kg)    HIGH Care Level  Kerman Passey MS, RD, LDN [phone] Pager

## 2014-09-15 NOTE — Progress Notes (Signed)
ANTIBIOTIC CONSULT NOTE - INITIAL  Pharmacy Consult for Vancomycin/Levaquin Indication: sepsis/rule out pneumonia  Allergies  Allergen Reactions  . Penicillins Hives    Patient Measurements: Height: 5\' 10"  (177.8 cm) Weight: 184 lb 8 oz (83.689 kg) IBW/kg (Calculated) : 73   Vital Signs: Temp: 97.5 F (36.4 C) (08/22 0958) BP: 100/69 mmHg (08/22 0815) Pulse Rate: 113 (08/22 0815) Intake/Output from previous day:   Intake/Output from this shift:    Labs:  Recent Labs  08/26/2014 0249  WBC 43.3*  HGB 10.0*  PLT 365  CREATININE 1.68*   Estimated Creatinine Clearance: 51.9 mL/min (by C-G formula based on Cr of 1.68). No results for input(s): VANCOTROUGH, VANCOPEAK, VANCORANDOM, GENTTROUGH, GENTPEAK, GENTRANDOM, TOBRATROUGH, TOBRAPEAK, TOBRARND, AMIKACINPEAK, AMIKACINTROU, AMIKACIN in the last 72 hours.    Kinetics:   Ke: 0.05 Vd: 58.6  Microbiology: Recent Results (from the past 720 hour(s))  Blood culture (routine x 2)     Status: None (Preliminary result)   Collection Time: 09/10/2014  4:08 AM  Result Value Ref Range Status   Specimen Description BLOOD LEFT ARM  Final   Special Requests BOTTLES DRAWN AEROBIC AND ANAEROBIC 5CC  Final   Culture NO GROWTH < 12 HOURS  Final   Report Status PENDING  Incomplete  Blood culture (routine x 2)     Status: None (Preliminary result)   Collection Time: 08/30/2014  4:08 AM  Result Value Ref Range Status   Specimen Description BLOOD RIGHT  Final   Special Requests BOTTLES DRAWN AEROBIC AND ANAEROBIC 5CC  Final   Culture NO GROWTH < 12 HOURS  Final   Report Status PENDING  Incomplete    Medical History: Past Medical History  Diagnosis Date  . DVT (deep venous thrombosis)   . Pancreatic cancer     Medications:  Scheduled:  . gabapentin  300 mg Oral TID  . heparin  5,000 Units Subcutaneous 3 times per day  . [START ON 09/16/2014] levofloxacin (LEVAQUIN) IV  750 mg Intravenous Q24H  . LORazepam  2 mg Oral TID  .  pantoprazole  40 mg Oral Daily  . sertraline  200 mg Oral Daily  . sodium chloride  3 mL Intravenous Q12H  . vancomycin  1,000 mg Intravenous Q12H   Infusions:  . sodium chloride     PRN: acetaminophen **OR** acetaminophen, LORazepam, ondansetron **OR** ondansetron (ZOFRAN) IV, oxyCODONE  Assessment: 54 y/o M with h/o stage IV pancreatic CA admitted with acute respiratory failure, sepsis, and possible PNA.    Goal of Therapy:  Vancomycin trough level 15-20 mcg/ml  Plan:  Vancomycin 1000 mg iv once given. Will order vancomycin 1000 mg iv q 12 hours with stacked dosing and a trough with the 5th dose.   Levaquin 750 mg iv q 24 hours.   Will f/u renal function and culture results.   Ulice Dash D 09/23/2014,10:39 AM

## 2014-09-15 NOTE — Progress Notes (Signed)
Pt intubated at [facility] by MD Mungal, direct visualization of condensation in ET tube, positive yellow change of CO2 monitor, sedation given at [facility] nursing will cont to monitor

## 2014-09-15 NOTE — ED Provider Notes (Signed)
[facility] Emergency Department Provider Note  ____________________________________________  Time seen: Approximately 230 AM  I have reviewed the triage vital signs and the nursing notes.   HISTORY  Chief Complaint Shortness of Breath  History is limited by the patient's acute distress.  HPI Steven Pineda is a 54 y.o. male who was brought in by ambulance with acute shortness of breath. According to EMS the patient has a history of stage IV pancreatic cancer and was seen [facility] at the beginning of the month for an infection. The patient signed out [place] after 1 day but has had increasing weakness and shortness of breath since then. The patient according to EMS was confused and agitated on their arrival. There report that his oxygen saturations were 85% on room air and he could hear significant wheezing. The patient received 2 DuoNeb's by EMS and was brought in for evaluation. The patient denies being a smoker and denies any chest pain at this time.   Past Medical History  Diagnosis Date  . DVT (deep venous thrombosis)   . Pancreatic cancer     There are no active problems to display for this patient.   History reviewed. No pertinent past surgical history.  No current outpatient prescriptions on file.  Allergies Penicillins  History reviewed. No pertinent family history.  Social History Social History  Substance Use Topics  . Smoking status: Former Research scientist (life sciences)  . Smokeless tobacco: Never Used  . Alcohol Use: None    Review of Systems Constitutional: No fever/chills Eyes: No visual changes. ENT: No sore throat. Cardiovascular: Denies chest pain. Respiratory: shortness of breath. Gastrointestinal: No abdominal pain.  No nausea, no vomiting.  No diarrhea.  No constipation. Genitourinary: Negative for dysuria. Musculoskeletal: back pain. Skin: Negative for rash. Neurological: Generalized weakness and difficulty  walking  10-point ROS otherwise negative.  ____________________________________________   PHYSICAL EXAM:  VITAL SIGNS: ED Triage Vitals  Enc Vitals Group     BP 09/19/2014 0251 111/83 mmHg     Pulse Rate 08/27/2014 0251 124     Resp 09/23/2014 0251 32     Temp --      Temp src --      SpO2 09/18/2014 0245 94 %     Weight 09/01/2014 0251 184 lb 8 oz (83.689 kg)     Height 09/11/2014 0327 5\' 10"  (1.778 m)     Head Cir --      Peak Flow --      Pain Score --      Pain Loc --      Pain Edu? --      Excl. in [place]? --     Constitutional: Alert and oriented. Well appearing and in severe distress. Eyes: Conjunctivae are normal. PERRL. EOMI. Head: Atraumatic. Nose: No congestion/rhinnorhea. Mouth/Throat: Mucous membranes are moist.  Oropharynx non-erythematous. Cardiovascular: Tachycardic, regular rhythm. Grossly normal heart sounds.  Good peripheral circulation. Respiratory: Increased respiratory effort. With retractions. Diminished breath sounds throughout. Gastrointestinal: firm, positive bowel sounds, distention.  Musculoskeletal: No lower extremity tenderness nor edema.   Neurologic:  Normal speech and language.  Skin:  Skin is warm, dry and intact. No rash noted. Psychiatric: Mood and affect are normal.   ____________________________________________   LABS (all labs ordered are listed, but only abnormal results are displayed)  Labs Reviewed  BLOOD GAS, ARTERIAL - Abnormal; Notable for the following:    pO2, Arterial 76 (*)    Bicarbonate 18.2 (*)    Acid-base deficit 6.4 (*)  All other components within normal limits  CBC - Abnormal; Notable for the following:    WBC 43.3 (*)    RBC 3.48 (*)    Hemoglobin 10.0 (*)    HCT 30.9 (*)    RDW 20.3 (*)    All other components within normal limits  BASIC METABOLIC PANEL - Abnormal; Notable for the following:    Sodium 133 (*)    CO2 18 (*)    Glucose, Bld 118 (*)    BUN 57 (*)    Creatinine, Ser 1.68 (*)    Calcium 7.8 (*)     GFR calc non Af Amer 45 (*)    GFR calc Af Amer 52 (*)    All other components within normal limits  LACTIC ACID, PLASMA - Abnormal; Notable for the following:    Lactic Acid, Venous 3.9 (*)    All other components within normal limits  CULTURE, BLOOD (ROUTINE X 2)  CULTURE, BLOOD (ROUTINE X 2)  LACTIC ACID, PLASMA   ____________________________________________  EKG  ED ECG REPORT I, Loney Hering, the attending physician, personally viewed and interpreted this ECG.   Date: 09/08/2014  EKG Time: 127  Rate: 127  Rhythm: sinus tachycardia  Axis: Normal  Intervals:none  ST&T Change: None  ____________________________________________  RADIOLOGY  CXR: Near complete white out of the right hemithorax consistent with large pleural effusion and/or consolidation or atelectasis. ____________________________________________   PROCEDURES  Procedure(s) performed: None  Critical Care performed: Yes, see critical care note(s)  CRITICAL CARE Performed by: Charlesetta Ivory P   Total critical care time: 48min  Critical care time was exclusive of separately billable procedures and treating other patients.  Critical care was necessary to treat or prevent imminent or life-threatening deterioration.  Critical care was time spent personally by me on the following activities: development of treatment plan with patient and/or surrogate as well as nursing, discussions with consultants, evaluation of patient's response to treatment, examination of patient, obtaining history from patient or surrogate, ordering and performing treatments and interventions, ordering and review of laboratory studies, ordering and review of radiographic studies, pulse oximetry and re-evaluation of patient's condition.  ____________________________________________   INITIAL IMPRESSION / ASSESSMENT AND PLAN / ED COURSE  Pertinent labs & imaging results that were available during my care of the patient  were reviewed by me and considered in my medical decision making (see chart for details).  The patient is a 54 year old male who is a history of pancreatic cancer who comes in with hypoxia and difficulty breathing. I did discuss the patient with his sisters and they report that he has been having a very difficult time walking holding things and he has not been eating or drinking. There report that he has not been caring for himself and has been refusing care from doctors and hospitals. The patient was placed on BiPAP when he arrived to the hospital and given a liter of normal saline. Once the blood work returned I did give him some level floxacillin and vancomycin as well as 2 more liters of normal saline. The patient does have a large pleural effusion but likely has a pneumonia given his white blood cell count in the 40s. The patient will be admitted to the hospitalist service for further evaluation and treatment of his pleural effusion, hypoxia and likely pneumonia. ____________________________________________   FINAL CLINICAL IMPRESSION(S) / ED DIAGNOSES  Final diagnoses:  SOB (shortness of breath)      Loney Hering, MD 09/23/2014  0753 

## 2014-09-15 NOTE — H&P (Signed)
[place] [facility] [place] NAME: Steven Pineda    MR#:  [id number]  DATE OF BIRTH:  October 29, 1960  DATE OF ADMISSION:  09/20/2014  PRIMARY CARE PHYSICIAN: No primary care provider on file.   REQUESTING/REFERRING PHYSICIAN: Dr. Dahlia Client  CHIEF COMPLAINT:   Chief Complaint  Patient presents with  . Shortness of Breath    HISTORY OF PRESENT ILLNESS:  Claud Gowan  is a 54 y.o. male with a known history of stage IV pancreatic cancer, DVT, recent admission at [facility] 10 days ago with some infection and signed AMA was brought in by the EMS with the complaints of worsening of shortness of breath with hypoxia with room air saturations in the mid 80s. Patient was noted to be in severe respiratory distress on arrival to the ED and was placed on BiPAP. Workup revealed elevated white blood cell count of 43.3, BUN/creatinine 57/1.68, lactic acid 3.9, chest x-ray with near complete whiteout of the right hemithorax with a large pleural effusion and consolidation/atelectasis. EKG sinus tachycardia with ventricular rate of 1 27 bpm. Of obtaining blood cultures patient was started on IV Levaquin and vancomycin and hospitalist service was consulted for further management. Patient is currently on BiPAP mask and resting comfortably and not cooperative for any history taking. Patient's aunts who are with the patient at this time does not know much about the patient since he was brought to [place] 6 months ago from [place] and according to them he has been getting chemotherapy at [facility] under care of oncologist and does not have a PCP and is full code. No further history is obtainable at this time.  PAST MEDICAL HISTORY:   Past Medical History  Diagnosis Date  . DVT (deep venous thrombosis)   . Pancreatic cancer     PAST SURGICAL HISTORY:   Past Surgical History  Procedure Laterality Date  . Hernia repair      SOCIAL HISTORY:   Social History   Substance Use Topics  . Smoking status: Former Research scientist (life sciences)  . Smokeless tobacco: Never Used  . Alcohol Use: Not on file    FAMILY HISTORY:  History reviewed. No pertinent family history.  DRUG ALLERGIES:   Allergies  Allergen Reactions  . Penicillins Hives    REVIEW OF SYSTEMS:   Review of Systems  Unable to perform ROS: medical condition   patient is noncooperative for history taking and examination  MEDICATIONS AT HOME:   Prior to Admission medications   Medication Sig Start Date End Date Taking? Authorizing Provider  ciprofloxacin (CIPRO) 500 MG tablet Take 1 tablet by mouth 2 (two) times daily. 09/01/14  Yes Historical Provider, MD  gabapentin (NEURONTIN) 300 MG capsule Take 1 capsule by mouth 3 (three) times daily. 08/13/14  Yes Historical Provider, MD  LORazepam (ATIVAN) 2 MG tablet Take 1 tablet by mouth 3 (three) times daily. 08/18/14  Yes Historical Provider, MD  oxyCODONE (ROXICODONE) 15 MG immediate release tablet Take 1 tablet by mouth every 4 (four) hours as needed. 08/29/14  Yes Historical Provider, MD  sertraline (ZOLOFT) 100 MG tablet Take 2 tablets by mouth daily. 08/18/14  Yes Historical Provider, MD      VITAL SIGNS:  Blood pressure 111/74, pulse 118, resp. rate 21, height 5\' 10"  (1.778 m), weight 83.689 kg (184 lb 8 oz), SpO2 94 %.  PHYSICAL EXAMINATION:  Physical Exam  Constitutional: No distress.  Chronically ill-looking  HENT:  Head: Normocephalic and atraumatic.  Right Ear: External  ear normal.  Left Ear: External ear normal.  Nose: Nose normal.  Mouth/Throat: Oropharynx is clear and moist. No oropharyngeal exudate.  Wearing BiPAP mask  Eyes: EOM are normal. Pupils are equal, round, and reactive to light. No scleral icterus.  Neck: Normal range of motion. Neck supple. No JVD present. No thyromegaly present.  Cardiovascular: Normal rate, regular rhythm, normal heart sounds and intact distal pulses.  Exam reveals no friction rub.   No murmur  heard. Respiratory: Effort normal. He exhibits no tenderness.  Diminished air entry on the right side  GI: Bowel sounds are normal. He exhibits distension and mass. There is tenderness. There is no rebound and no guarding.  Musculoskeletal: Normal range of motion. He exhibits no edema.  Lymphadenopathy:    He has no cervical adenopathy.  Neurological: He is alert. He has normal reflexes. He displays normal reflexes. No cranial nerve deficit. He exhibits normal muscle tone.  Oriented to name  Skin: Skin is warm. No rash noted. No erythema.  Psychiatric:  Unable to assess since patient not cooperative   LABORATORY PANEL:   CBC  Recent Labs Lab 08/29/2014 0249  WBC 43.3*  HGB 10.0*  HCT 30.9*  PLT 365   ------------------------------------------------------------------------------------------------------------------  Chemistries   Recent Labs Lab 09/04/2014 0249  NA 133*  K 4.9  CL 101  CO2 18*  GLUCOSE 118*  BUN 57*  CREATININE 1.68*  CALCIUM 7.8*   ------------------------------------------------------------------------------------------------------------------  Cardiac Enzymes No results for input(s): TROPONINI in the last 168 hours. ------------------------------------------------------------------------------------------------------------------  RADIOLOGY:  Dg Chest Port 1 View  08/27/2014   CLINICAL DATA:  Shortness of breath. Recent treatment for infection. Decreased oxygen saturation.  EXAM: PORTABLE CHEST - 1 VIEW  COMPARISON:  None.  FINDINGS: Near complete white out of the right hemi thorax with aeration only of the right apex. This may indicate a large pleural effusion and/or consolidation or atelectasis on the right. Left lung is clear. Heart size as visualized appears normal. No pneumothorax. Right-sided Infuse-A-Port with tip over the low SVC region.  IMPRESSION: Near complete white out of the right hemi thorax consistent with large pleural effusion and/or  consolidation or atelectasis.   Electronically Signed   By: Lucienne Capers M.D.   On: 09/14/2014 03:18    EKG:   Orders placed or performed during the hospital encounter of 09/02/2014  . ED EKG  . ED EKG  Sinus tachycardia with ventricular rate of 1 27 bpm, no acute ischemic changes.  IMPRESSION AND PLAN:   54 year male with history of stage IV pancreatic cancer, DVT, recent admission at [facility] 10 days ago for some infection and signed AMA was brought in by EMS with the complaints of worsening shortness of breath with hypoxia. 1. Acute respiratory failure with hypoxia secondary to combination of right-sided pleural effusion and possible right-sided pneumonia. 2. Right-sided pleural effusion, infectious versus malignant. 3. Sepsis, possible pneumonia. 4. Stage IV pancreatic cancer, undergoing chemotherapy at [facility].  Plan: Admit to stepdown unit, continue O2 supplementation to BiPAP,monitor O2 saturations, continue IV antibiotics-vancomycin and Levaquin, follow-up CBC, blood and sputum cultures. Pulmonary consultation requested for evaluation of pleural effusion and respiratory failure. Oncology consultation requested for further care of pancreatic cancer. Consider palliative care consult.    All the records are reviewed and case discussed with ED provider. Management plans discussed with the patient, family and they are in agreement.  CODE STATUS: Full code. Discussed critical illness of the patient with the patient's family.  TOTAL TIME  TAKING CARE OF THIS PATIENT: 50 minutes.    Azucena Freed N M.D on 09/07/2014 at 5:31 AM  Between 7am to 6pm - Pager - [phone]  After 6pm go to [email or web address] - password [email or web address] [facility]  [facility]  Office  [phone]  CC: Primary care physician; No primary care provider on file.

## 2014-09-16 ENCOUNTER — Inpatient Hospital Stay: Payer: Medicare Other

## 2014-09-16 ENCOUNTER — Inpatient Hospital Stay: Admit: 2014-09-16 | Payer: Medicare Other

## 2014-09-16 ENCOUNTER — Inpatient Hospital Stay
Admit: 2014-09-16 | Discharge: 2014-09-16 | Disposition: A | Discharge status: Home / Self Care | Payer: Medicare Other | Attending: Internal Medicine | Admitting: Internal Medicine

## 2014-09-16 DIAGNOSIS — N179 Acute kidney failure, unspecified: Secondary | ICD-10-CM

## 2014-09-16 DIAGNOSIS — R7989 Other specified abnormal findings of blood chemistry: Secondary | ICD-10-CM | POA: Insufficient documentation

## 2014-09-16 DIAGNOSIS — J189 Pneumonia, unspecified organism: Secondary | ICD-10-CM | POA: Insufficient documentation

## 2014-09-16 DIAGNOSIS — J948 Other specified pleural conditions: Secondary | ICD-10-CM

## 2014-09-16 DIAGNOSIS — R945 Abnormal results of liver function studies: Secondary | ICD-10-CM

## 2014-09-16 LAB — RENAL FUNCTION PANEL
Albumin: 2 g/dL — ABNORMAL LOW (ref 3.5–5.0)
Albumin: 1.6 g/dL — ABNORMAL LOW (ref 3.5–5.0)
Anion gap: 12 (ref 5–15)
Anion gap: 15 (ref 5–15)
BUN: 76 mg/dL — ABNORMAL HIGH (ref 6–20)
BUN: 66 mg/dL — AB (ref 6–20)
CALCIUM: 7.8 mg/dL — AB (ref 8.9–10.3)
CHLORIDE: 104 mmol/L (ref 101–111)
CHLORIDE: 106 mmol/L (ref 101–111)
CO2: 19 mmol/L — AB (ref 22–32)
CO2: 15 mmol/L — ABNORMAL LOW (ref 22–32)
CREATININE: 2.8 mg/dL — AB (ref 0.61–1.24)
CREATININE: 2.35 mg/dL — AB (ref 0.61–1.24)
Calcium: 7.3 mg/dL — ABNORMAL LOW (ref 8.9–10.3)
GFR calc Af Amer: 28 mL/min — ABNORMAL LOW (ref >60)
GFR calc Af Amer: 34 mL/min — ABNORMAL LOW (ref >60)
GFR calc non Af Amer: 24 mL/min — ABNORMAL LOW (ref >60)
GFR, EST NON AFRICAN AMERICAN: 30 mL/min — AB (ref >60)
GLUCOSE: 154 mg/dL — AB (ref 65–99)
GLUCOSE: 170 mg/dL — AB (ref 65–99)
Phosphorus: 9.3 mg/dL — ABNORMAL HIGH (ref 2.5–4.6)
Phosphorus: 7.5 mg/dL — ABNORMAL HIGH (ref 2.5–4.6)
Potassium: 6.5 mmol/L — ABNORMAL HIGH (ref 3.5–5.1)
Potassium: 5.7 mmol/L — ABNORMAL HIGH (ref 3.5–5.1)
SODIUM: 135 mmol/L (ref 135–145)
Sodium: 136 mmol/L (ref 135–145)

## 2014-09-16 LAB — CBC
HCT: 32.3 % — ABNORMAL LOW (ref 40.0–52.0)
HCT: 28.6 % — ABNORMAL LOW (ref 40.0–52.0)
HEMATOCRIT: 29.6 % — AB (ref 40.0–52.0)
HEMOGLOBIN: 9.2 g/dL — AB (ref 13.0–18.0)
Hemoglobin: 10.4 g/dL — ABNORMAL LOW (ref 13.0–18.0)
Hemoglobin: 8.9 g/dL — ABNORMAL LOW (ref 13.0–18.0)
MCH: 28.9 pg (ref 26.0–34.0)
MCH: 28.9 pg (ref 26.0–34.0)
MCH: 28.4 pg (ref 26.0–34.0)
MCHC: 32.1 g/dL (ref 32.0–36.0)
MCHC: 31 g/dL — AB (ref 32.0–36.0)
MCHC: 31 g/dL — AB (ref 32.0–36.0)
MCV: 90.1 fL (ref 80.0–100.0)
MCV: 93.1 fL (ref 80.0–100.0)
MCV: 91.6 fL (ref 80.0–100.0)
PLATELETS: 285 10*3/uL (ref 150–440)
PLATELETS: 124 10*3/uL — AB (ref 150–440)
Platelets: 129 10*3/uL — ABNORMAL LOW (ref 150–440)
RBC: 3.59 MIL/uL — AB (ref 4.40–5.90)
RBC: 3.17 MIL/uL — ABNORMAL LOW (ref 4.40–5.90)
RBC: 3.13 MIL/uL — ABNORMAL LOW (ref 4.40–5.90)
RDW: 20.7 % — AB (ref 11.5–14.5)
RDW: 23.3 % — AB (ref 11.5–14.5)
RDW: 22.5 % — AB (ref 11.5–14.5)
WBC: 41.1 10*3/uL — ABNORMAL HIGH (ref 3.8–10.6)
WBC: 55.8 10*3/uL (ref 3.8–10.6)
WBC: 52.8 10*3/uL — AB (ref 3.8–10.6)

## 2014-09-16 LAB — BLOOD GAS, ARTERIAL
ACID-BASE DEFICIT: 9.5 mmol/L — AB (ref 0.0–2.0)
Allens test (pass/fail): POSITIVE — AB
BICARBONATE: 14.4 meq/L — AB (ref 21.0–28.0)
FIO2: 0.4
MECHVT: 450 mL
O2 SAT: 93.6 %
PATIENT TEMPERATURE: 37
PCO2 ART: 26 mmHg — AB (ref 32.0–48.0)
PEEP/CPAP: 5 cmH2O
PH ART: 7.35 (ref 7.350–7.450)
RATE: 20 resp/min
pO2, Arterial: 73 mmHg — ABNORMAL LOW (ref 83.0–108.0)

## 2014-09-16 LAB — BASIC METABOLIC PANEL
ANION GAP: 14 (ref 5–15)
BUN: 65 mg/dL — ABNORMAL HIGH (ref 6–20)
CALCIUM: 7 mg/dL — AB (ref 8.9–10.3)
CHLORIDE: 103 mmol/L (ref 101–111)
CO2: 15 mmol/L — AB (ref 22–32)
CREATININE: 2.53 mg/dL — AB (ref 0.61–1.24)
GFR calc non Af Amer: 27 mL/min — ABNORMAL LOW (ref >60)
GFR, EST AFRICAN AMERICAN: 31 mL/min — AB (ref >60)
GLUCOSE: 291 mg/dL — AB (ref 65–99)
Potassium: 5.8 mmol/L — ABNORMAL HIGH (ref 3.5–5.1)
Sodium: 132 mmol/L — ABNORMAL LOW (ref 135–145)

## 2014-09-16 LAB — TRIGLYCERIDES: Triglycerides: 424 mg/dL — ABNORMAL HIGH (ref <150)

## 2014-09-16 LAB — CKMB (ARMC ONLY)
CK, MB: 19.6 ng/mL — ABNORMAL HIGH (ref 0.5–5.0)
CK, MB: 33 ng/mL — AB (ref 0.5–5.0)

## 2014-09-16 LAB — TROPONIN I
TROPONIN I: 0.09 ng/mL — AB (ref <0.031)
TROPONIN I: 0.08 ng/mL — AB (ref <0.031)

## 2014-09-16 LAB — PH, BODY FLUID: pH, Body Fluid: 7

## 2014-09-16 LAB — COMPREHENSIVE METABOLIC PANEL
ALK PHOS: 468 U/L — AB (ref 38–126)
ALK PHOS: 497 U/L — AB (ref 38–126)
ALT: 206 U/L — AB (ref 17–63)
ALT: 230 U/L — AB (ref 17–63)
AST: 782 U/L — AB (ref 15–41)
AST: 972 U/L — AB (ref 15–41)
Albumin: 2.1 g/dL — ABNORMAL LOW (ref 3.5–5.0)
Albumin: 2 g/dL — ABNORMAL LOW (ref 3.5–5.0)
Anion gap: 13 (ref 5–15)
Anion gap: 12 (ref 5–15)
BUN: 75 mg/dL — AB (ref 6–20)
BUN: 77 mg/dL — AB (ref 6–20)
CALCIUM: 8 mg/dL — AB (ref 8.9–10.3)
CALCIUM: 7.6 mg/dL — AB (ref 8.9–10.3)
CHLORIDE: 105 mmol/L (ref 101–111)
CO2: 17 mmol/L — AB (ref 22–32)
CO2: 17 mmol/L — AB (ref 22–32)
CREATININE: 2.57 mg/dL — AB (ref 0.61–1.24)
CREATININE: 2.9 mg/dL — AB (ref 0.61–1.24)
Chloride: 105 mmol/L (ref 101–111)
GFR calc non Af Amer: 27 mL/min — ABNORMAL LOW (ref >60)
GFR, EST AFRICAN AMERICAN: 31 mL/min — AB (ref >60)
GFR, EST AFRICAN AMERICAN: 27 mL/min — AB (ref >60)
GFR, EST NON AFRICAN AMERICAN: 23 mL/min — AB (ref >60)
Glucose, Bld: 129 mg/dL — ABNORMAL HIGH (ref 65–99)
Glucose, Bld: 153 mg/dL — ABNORMAL HIGH (ref 65–99)
Potassium: 6.2 mmol/L — ABNORMAL HIGH (ref 3.5–5.1)
Potassium: 6.5 mmol/L — ABNORMAL HIGH (ref 3.5–5.1)
SODIUM: 135 mmol/L (ref 135–145)
SODIUM: 134 mmol/L — AB (ref 135–145)
Total Bilirubin: 4.2 mg/dL — ABNORMAL HIGH (ref 0.3–1.2)
Total Bilirubin: 4.1 mg/dL — ABNORMAL HIGH (ref 0.3–1.2)
Total Protein: 6.2 g/dL — ABNORMAL LOW (ref 6.5–8.1)
Total Protein: 5.9 g/dL — ABNORMAL LOW (ref 6.5–8.1)

## 2014-09-16 LAB — GLUCOSE, CAPILLARY
GLUCOSE-CAPILLARY: 131 mg/dL — AB (ref 65–99)
GLUCOSE-CAPILLARY: 193 mg/dL — AB (ref 65–99)
Glucose-Capillary: 125 mg/dL — ABNORMAL HIGH (ref 65–99)
Glucose-Capillary: 151 mg/dL — ABNORMAL HIGH (ref 65–99)

## 2014-09-16 LAB — AMMONIA: AMMONIA: 69 umol/L — AB (ref 9–35)

## 2014-09-16 LAB — LACTATE DEHYDROGENASE: LDH: 7561 U/L — ABNORMAL HIGH (ref 98–192)

## 2014-09-16 LAB — MAGNESIUM
Magnesium: 3.5 mg/dL — ABNORMAL HIGH (ref 1.7–2.4)
Magnesium: 3 mg/dL — ABNORMAL HIGH (ref 1.7–2.4)

## 2014-09-16 MED ORDER — NOREPINEPHRINE 4 MG/250ML-% IV SOLN
INTRAVENOUS | Status: AC
  Administered 2014-09-16: 5 ug/min via INTRAVENOUS
  Filled 2014-09-16: qty 250

## 2014-09-16 MED ORDER — NOREPINEPHRINE 4 MG/250ML-% IV SOLN
0.0000 ug/min | INTRAVENOUS | Status: DC
Start: 2014-09-16 — End: 2014-09-16
  Administered 2014-09-16: 40 ug/min via INTRAVENOUS
  Filled 2014-09-16: qty 250

## 2014-09-16 MED ORDER — INSULIN REGULAR HUMAN 100 UNIT/ML IJ SOLN
10.0000 [IU] | Freq: Once | INTRAMUSCULAR | Status: DC
  Filled 2014-09-16: qty 0.1

## 2014-09-16 MED ORDER — PUREFLOW DIALYSIS SOLUTION
INTRAVENOUS | Status: DC
  Administered 2014-09-16: 12:00:00 via INTRAVENOUS_CENTRAL

## 2014-09-16 MED ORDER — HEPARIN SODIUM (PORCINE) 1000 UNIT/ML DIALYSIS
1000.0000 [IU] | INTRAMUSCULAR | Status: DC | PRN
  Filled 2014-09-16: qty 6

## 2014-09-16 MED ORDER — NOREPINEPHRINE 4 MG/250ML-% IV SOLN
2.0000 ug/min | INTRAVENOUS | Status: DC
  Administered 2014-09-16: 5 ug/min via INTRAVENOUS

## 2014-09-16 MED ORDER — HEPARIN SODIUM (PORCINE) 1000 UNIT/ML DIALYSIS
1000.0000 [IU] | INTRAMUSCULAR | Status: DC | PRN
  Administered 2014-09-16: 2600 [IU] via INTRAVENOUS_CENTRAL
  Filled 2014-09-16: qty 1

## 2014-09-16 MED ORDER — GABAPENTIN 300 MG PO CAPS
300.0000 mg | ORAL_CAPSULE | Freq: Every day | ORAL | Status: DC
  Administered 2014-09-16: 300 mg via ORAL
  Filled 2014-09-16: qty 1

## 2014-09-16 MED ORDER — DEXTROSE 50 % IV SOLN
1.0000 | Freq: Once | INTRAVENOUS | Status: AC
  Administered 2014-09-16: 50 mL via INTRAVENOUS
  Filled 2014-09-16: qty 50

## 2014-09-16 MED ORDER — NOREPINEPHRINE 4 MG/250ML-% IV SOLN
2.0000 ug/min | INTRAVENOUS | Status: DC
  Administered 2014-09-16: 15 ug/min via INTRAVENOUS
  Administered 2014-09-16: 8 ug/min via INTRAVENOUS
  Filled 2014-09-16 (×2): qty 250

## 2014-09-16 MED ORDER — DEXTROSE 5 % IV SOLN
0.0000 ug/min | INTRAVENOUS | Status: DC
  Administered 2014-09-16: 35 ug/min via INTRAVENOUS
  Administered 2014-09-17: 45 ug/min via INTRAVENOUS
  Filled 2014-09-16 (×3): qty 16

## 2014-09-16 MED ORDER — VECURONIUM BROMIDE 10 MG IV SOLR
10.0000 mg | Freq: Once | INTRAVENOUS | Status: AC
  Administered 2014-09-16: 10 mg via INTRAVENOUS

## 2014-09-16 MED ORDER — VANCOMYCIN HCL IN DEXTROSE 1-5 GM/200ML-% IV SOLN
1000.0000 mg | INTRAVENOUS | Status: DC
  Administered 2014-09-16: 1000 mg via INTRAVENOUS
  Filled 2014-09-16 (×2): qty 200

## 2014-09-16 MED ORDER — SODIUM CHLORIDE 0.9 % IV SOLN: 100.0000 mL | INTRAVENOUS | Status: DC | PRN

## 2014-09-16 MED ORDER — SODIUM BICARBONATE 8.4 % IV SOLN
50.0000 meq | Freq: Once | INTRAVENOUS | Status: AC
  Administered 2014-09-16: 50 meq via INTRAVENOUS
  Filled 2014-09-16: qty 50

## 2014-09-16 MED ORDER — NOREPINEPHRINE BITARTRATE 1 MG/ML IV SOLN: 0.0000 ug/min | INTRAVENOUS | Status: DC

## 2014-09-16 MED ORDER — SODIUM CHLORIDE 0.9 % IV BOLUS (SEPSIS)
1000.0000 mL | Freq: Once | INTRAVENOUS | Status: AC
  Administered 2014-09-16: 1000 mL via INTRAVENOUS

## 2014-09-16 MED ORDER — INSULIN REGULAR HUMAN 100 UNIT/ML IJ SOLN
10.0000 [IU] | Freq: Once | INTRAMUSCULAR | Status: AC
  Administered 2014-09-16: 10 [IU] via INTRAVENOUS
  Filled 2014-09-16: qty 0.1

## 2014-09-16 MED ORDER — VECURONIUM BROMIDE 10 MG IV SOLR
INTRAVENOUS | Status: AC
  Administered 2014-09-16: 10 mg via INTRAVENOUS
  Filled 2014-09-16: qty 10

## 2014-09-16 MED ORDER — LEVOFLOXACIN IN D5W 500 MG/100ML IV SOLN
500.0000 mg | INTRAVENOUS | Status: DC
  Administered 2014-09-16: 500 mg via INTRAVENOUS
  Filled 2014-09-16 (×2): qty 100

## 2014-09-16 MED ORDER — PANTOPRAZOLE SODIUM 40 MG IV SOLR
40.0000 mg | Freq: Two times a day (BID) | INTRAVENOUS | Status: DC
  Administered 2014-09-16 – 2014-09-17 (×3): 40 mg via INTRAVENOUS
  Filled 2014-09-16 (×3): qty 40

## 2014-09-16 MED ORDER — MEROPENEM 1 G IV SOLR
1.0000 g | Freq: Two times a day (BID) | INTRAVENOUS | Status: DC
  Administered 2014-09-16 (×2): 1 g via INTRAVENOUS
  Filled 2014-09-16 (×5): qty 1

## 2014-09-16 MED ORDER — ALTEPLASE 2 MG IJ SOLR: 2.0000 mg | Freq: Once | INTRAMUSCULAR | Status: DC | PRN

## 2014-09-16 MED ORDER — ALTEPLASE 100 MG IV SOLR
2.0000 mg | Freq: Once | INTRAVENOUS | Status: AC
  Administered 2014-09-16: 2 mg

## 2014-09-16 MED ORDER — NOREPINEPHRINE BITARTRATE 1 MG/ML IV SOLN: 2.0000 ug/min | INTRAVENOUS | Status: DC

## 2014-09-16 NOTE — Progress Notes (Signed)
Order received for cathflo activase to be placed to vascular catheter for one hour and then restart CRRT if able.

## 2014-09-16 NOTE — Progress Notes (Signed)
CRRT manual rinse back completed due to high venous pressures.  Dr. Candiss Norse paged.

## 2014-09-16 NOTE — Progress Notes (Signed)
Dr. Carroll Kinds at bedside and placed temporary vascular catheter to right IJ.

## 2014-09-16 NOTE — Procedures (Signed)
Procedure Note:  Arterial Line Placement - Right Femoral Steven Pineda , [id number] , IC18A/IC18A-[id number]  Indications:  Hemodynamic instability / recurrent ABG draws  Benefits, risks (including bleeding, infection,  Injury, etc.), and alternatives explained to [name] who voiced understanding.  Questions were sought and answered.  Steven Pineda agreed to proceed with the procedure.  Consent is signed and on chart.    Allen's test performed to ensure adequate perfusion.  Time out was performed verifying correct patient, procedure, site, positioning, and special catheter was available at the time of procedure.  Patient's RIGHT femoral  artery was prepped and draped in usual sterile fashion.  1 % Lidocaine was used to anesthetize the area.  Total number of attempts were 3.  A 20 gauge arterial line was introduced into the RIGHT FEMORAL artery under ultrasound guidance.  Catheter threaded and the needle was removed with appropriate blood return.  Blood loss was minimal.  Patient tolerated the procedure well, and there were no complications.     Vilinda Boehringer, MD [facility] Pager [phone] (please enter 7-digits)

## 2014-09-16 NOTE — Progress Notes (Signed)
Dr. Candiss Norse called and notified that CRRT machine continues to alarm with high venous pressures after cathflo indwelled for 1 hour. Order received to d/c CRRT.

## 2014-09-16 NOTE — Progress Notes (Signed)
CRRT started per policy and procedure 

## 2014-09-16 NOTE — Progress Notes (Signed)
Pt remains on vent. Vent settings as charted. Sedated with propofol 53mcg. Tube feeding is in progress. Tolerating good.s tach on Cm. Foley in place. UOp only 119ml.. Temp 99.2. Lung sound diminished. Resting in bed without any distress. Continue to observe c;losely.

## 2014-09-16 NOTE — Progress Notes (Signed)
Subglottic 132mmhg

## 2014-09-16 NOTE — Progress Notes (Signed)
Nutrition Follow-up    INTERVENTION:   EN: recommend restarting TF as tolerated post [place] today; recommend titrating TF as tolerated to goal rate as per order set. Noted pt with hyperkalemia and hyperphosphatemia, initiation of CRRT should improve/resolve this. Continue to assess   NUTRITION DIAGNOSIS:   Inadequate oral intake related to acute illness as evidenced by NPO status. Being addressed via TF  GOAL:   Provide needs based on ASPEN/SCCM guidelines  MONITOR:    (Energy Intake, EN, Digestive System, Electrolyte/Renal Profile, Anthropometrics)   ASSESSMENT:    Pt remains on vent, s/p thoracentesis with 1.8 L removed with concern for hemothorax and possible empyema, worsening hypotension on levophed (15 mcg/min), ARF with plans to start CRRT today  EN: TF on hold for US abdomen, TF started yesterday at rate of 20 ml/hr  Digestive System: residuals 150 mL, no signs of TF intolerance  Skin:  Reviewed, no issues  Last BM:   no BM   Recent Labs Lab 09/21/2014 1343 09/16/14 0716 09/16/14 1113  NA 134* 135 135  K 5.2* 6.2* 6.5*  CL 103 105 104  CO2 20* 17* 19*  BUN 60* 75* 76*  CREATININE 1.61* 2.57* 2.80*  CALCIUM 7.8* 8.0* 7.8*  MG  --   --  3.5*  PHOS  --   --  9.3*  GLUCOSE 144* 129* 154*    Glucose Profile:  Recent Labs  09/16/14 0358 09/16/14 0718 09/16/14 1224  GLUCAP 131* 125* 151*   Nutritional Anemia Profile:  CBC Latest Ref Rng 09/16/2014 09/21/2014 09/24/2014  WBC 3.8 - 10.6 K/uL 41.1(H) - 43.3(H)  Hemoglobin 13.0 - 18.0 g/dL 10.4(L) 9.9(L) 10.0(L)  Hematocrit 40.0 - 52.0 % 32.3(L) 31.6(L) 30.9(L)  Platelets 150 - 440 K/uL 285 - 365    Meds: reviewed  Height:   Ht Readings from Last 1 Encounters:  09/03/2014 5\' 10"  (1.778 m)    Weight:   Wt Readings from Last 1 Encounters:  09/12/2014 186 lb 11.7 oz (84.7 kg)    BMI:  Body mass index is 26.79 kg/(m^2).  Estimated Nutritional Needs:   Kcal:  1677 kcals (Ve; 5.5, Tmax: 36.4) using wt  of 83.7 kg  Protein:  126-168 g (1.5-2.0 g/kg)   Fluid:  2100-2520 mL (25-30 ml/kg)   HIGH Care Level  Kerman Passey MS, RD, LDN [phone] Pager

## 2014-09-16 NOTE — Progress Notes (Signed)
*  PRELIMINARY RESULTS* Echocardiogram 2D Echocardiogram has been performed.  Steven Pineda 09/16/2014, 7:23 PM

## 2014-09-16 NOTE — Procedures (Signed)
  Procedure Note: [facility] Catheter Placement [name] , [id number] , IC18A/IC18A-AA  Indications: CRRT/HD  Benefits, risks (including bleeding, infection,  Injury, etc.), and alternatives explained to [name] who voiced understanding.  Questions were sought and answered.  [name] agreed to proceed with the procedure.  Consent is signed and on chart. A time-out was completed verifying correct patient, procedure and site. A double lumen  HD catheter available at the time of procedure.  The patient was placed in a dependent position appropriate for central line placement based on the vein to be cannulated.  The patient's LEFT Femoral Veinwas prepped and draped in a sterile fashion. 1% Lidocaine was NOT used to anesthetize the surrounding skin area.  A double lumen HD catheter was introduced into the LEFT Femoral vein using Seldinger technique.  The catheter was threaded smoothly over the guide wire and appropriate blood return was obtained.  Each lumen of the catheter was evacuated of air and flushed with sterile saline.  The catheter was then sutured in place to the skin and a sterile dressing applied.  Perfusion to the extremity distal to the point of catheter insertion was checked and found to be adequate.    The patient tolerated the procedure well and there were no complications.  Vilinda Boehringer, MD [place] [facility] Pager [phone] (please enter [phone])

## 2014-09-16 NOTE — Progress Notes (Signed)
Fentanyl given for pain prior to central line placement.

## 2014-09-16 NOTE — Progress Notes (Signed)
CRRT filter clotted.  New filter to be strung.

## 2014-09-16 NOTE — Progress Notes (Signed)
[facility] NAME: Steven Pineda    MR#:  [id number]  DATE OF BIRTH:  [birth year]  SUBJECTIVE: Patient critically ill with respiratory failure and full vent support support,/now going to be started on CRRT. On levaphed.  CHIEF COMPLAINT:   Chief Complaint  Patient presents with  . Shortness of Breath    REVIEW OF SYSTEMS:   Review of Systems  Unable to perform ROS: critical illness     DRUG ALLERGIES:   Allergies  Allergen Reactions  . Penicillins Hives    VITALS:  Blood pressure 84/61, pulse 128, temperature 98 F (36.7 C), temperature source Oral, resp. rate 21, height 5\' 10"  (1.778 m), weight 84.7 kg (186 lb 11.7 oz), SpO2 95 %.  PHYSICAL EXAMINATION:  GENERAL:  54 y.o.-year-old patient lying in the bed ,critically ill. On full vent support, full dose sedation. EYES: Pupils equal, round, reactive to light and accommodation. HEENT: Head atraumatic, normocephalic. Orally intubated. NECK:  Supple, no jugular venous distention. No thyroid enlargement, no tenderness.  LUNGS: Decreased breath sounds bilaterally. CARDIOVASCULAR: S1, S2 normal. Tachycardic No murmurs, rubs, or gallops.  ABDOMEN: Soft, nontender, nondistended. Bowel sounds present. No organomegaly or mass.  EXTREMITIES: No pedal edema, cyanosis, or clubbing.  NEUROLOGIC: Critically ill, unable to do full neurological exam because he is  Intubated/, sedated  PSYCHIATRIC: intubated/, sedated SKIN: No obvious rash, lesion, or ulcer.    LABORATORY PANEL:   CBC  Recent Labs Lab 09/16/14 0716  WBC 41.1*  HGB 10.4*  HCT 32.3*  PLT 285   ------------------------------------------------------------------------------------------------------------------  Chemistries   Recent Labs Lab 09/16/14 0716 09/16/14 1113  NA 135 135  K 6.2* 6.5*  CL 105 104  CO2 17* 19*  GLUCOSE 129* 154*  BUN 75* 76*  CREATININE 2.57* 2.80*  CALCIUM 8.0* 7.8*   AST 782*  --   ALT 206*  --   ALKPHOS 468*  --   BILITOT 4.2*  --    ------------------------------------------------------------------------------------------------------------------  Cardiac Enzymes No results for input(s): TROPONINI in the last 168 hours. ------------------------------------------------------------------------------------------------------------------  RADIOLOGY:  Dg Abd 1 View  09/13/2014   CLINICAL DATA:  Enteric catheter placement.  Status post intubation.  EXAM: ABDOMEN - 1 VIEW  COMPARISON:  None.  FINDINGS: Limited view of the abdomen demonstrates enteric catheter overlying the expected location of gastric body, with side port 5 cm inferior to the expected location of gastro esophageal junction. Bowel gas pattern, where visualized, is nonobstructive. Again seen is complete opacification of the right hemithorax.  IMPRESSION: Enteric catheter placement, side port slightly inferior to the expected location of gastrointestinal junction.   Electronically Signed   By: Fidela Salisbury M.D.   On: 08/27/2014 13:12   Dg Chest Port 1 View  09/01/2014   CLINICAL DATA:  Right pleural effusion, status post right thoracentesis.  EXAM: PORTABLE CHEST - 1 VIEW  COMPARISON:  09/06/2014.  FINDINGS: A right pleural effusion has decreased in size. There appears to be a small to moderate right effusion present.  Right lower lung atelectasis is identified.  The cardiomediastinal silhouette is unchanged.  An endotracheal tube with tip 3.2 cm above the carina, NG tube entering the stomach with tip off the field of view and right IJ Port-A-Cath with tip overlying the superior cavoatrial junction again noted. There is no evidence of pneumothorax.  IMPRESSION: Decreased right pleural effusion status post right thoracentesis. No evidence of pneumothorax.  Right lower lung atelectasis and support apparatus  as described.   Electronically Signed   By: Margarette Canada M.D.   On: 09/07/2014 15:47   Dg  Chest Port 1 View  08/26/2014   CLINICAL DATA:  Right posttherapy failure. Status post intubation and line placement.  EXAM: PORTABLE CHEST - 1 VIEW  COMPARISON:  09/08/2014  FINDINGS: There has been interval intubation with the endotracheal tube overlying the tracheal air column and terminating 3.3 cm superior to the carina. Right-sided central venous catheter has been placed, tip overlying the expected location of cavoatrial junction. Enteric catheter transverses the thorax, tip collimated off the image.  Cardiomediastinal silhouette and mediastinal contours are partially obscured by complete opacification of the right hemithorax.  The left lung is clear.  There is no evidence of pneumothorax.  Osseous structures are without acute abnormality. Soft tissues are grossly normal.  IMPRESSION: Status post intubation, endotracheal tube 3.3 cm superior to the carina.  New complete opacification of the right hemithorax, slightly worse than on the prior radiograph.   Electronically Signed   By: Fidela Salisbury M.D.   On: 09/14/2014 13:09   Dg Chest Port 1 View  09/07/2014   CLINICAL DATA:  Shortness of breath. Recent treatment for infection. Decreased oxygen saturation.  EXAM: PORTABLE CHEST - 1 VIEW  COMPARISON:  None.  FINDINGS: Near complete white out of the right hemi thorax with aeration only of the right apex. This may indicate a large pleural effusion and/or consolidation or atelectasis on the right. Left lung is clear. Heart size as visualized appears normal. No pneumothorax. Right-sided Infuse-A-Port with tip over the low SVC region.  IMPRESSION: Near complete white out of the right hemi thorax consistent with large pleural effusion and/or consolidation or atelectasis.   Electronically Signed   By: Lucienne Capers M.D.   On: 09/04/2014 03:18   US Thoracentesis Asp Pleural Space W/img Guide  09/06/2014   INDICATION: History of stage IV pancreatic cancer, now with large symptomatic right-sided pleural  effusion. Please perform ultrasound-guided right-sided thoracentesis for diagnostic and therapeutic purposes.  EXAM: US THORACENTESIS ASP PLEURAL SPACE W/IMG GUIDE  COMPARISON:  Chest radiograph -09/23/2014  MEDICATIONS: None  COMPLICATIONS: None immediate  TECHNIQUE: Informed written consent was obtained from the patient's family after a discussion of the risks, benefits and alternatives to treatment. A timeout was performed prior to the initiation of the procedure.  Initial ultrasound scanning demonstrates a large right-sided pleural effusion which is noted to contain multiple internal low level echoes. The antero lateral aspect of the right lower chest was prepped and draped in the usual sterile fashion. 1% lidocaine was used for local anesthesia. An ultrasound image was saved for documentation purposes. An 8 Fr Safe-T-Centesis catheter was introduced. The thoracentesis was performed. The catheter was removed and a dressing was applied. The patient tolerated the procedure well without immediate post procedural complication.  FINDINGS: A total of approximately 1.8 liters of blood tinged serous fluid was removed. Requested samples were sent to the laboratory.  As this was the patient's first thoracentesis, the thoracentesis was stopped following the acquisition of 1.8 L of fluid though additional fluid remains.  IMPRESSION: Successful ultrasound-guided right sided thoracentesis yielding 1.8 liters of blood tinged serous pleural fluid.   Electronically Signed   By: Sandi Mariscal M.D.   On: 09/06/2014 16:05    EKG:   Orders placed or performed during the hospital encounter of 09/14/2014  . ED EKG  . ED EKG  . EKG 12-Lead  . EKG 12-Lead  ASSESSMENT AND PLAN:   Acute respiratory failure  Due to Right pleural effusion ,;s/p emergent intubation;continue full vent support, nebulizers,appeciate DR,Mungal input. Regarding right pleural effusion status post thoracocentesis yesterday by Dr. Genevive Bi: Follow the  cultures cytology.  2; septic shock due to possible empyema, HCAP; continue vancomycin, Zosyn, Levaquin.  #3 acute renal failure with hyperkalemia seen by nephrology patient is going to get CRRT.  4,. Stage 4 pancreatic cancer; diagnosed 2 years ago. seen by oncology  here. 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 Prognosis is poor, elevated LFTs: Dr. Genevive Bi has spoken to Dr. Truman Hayward his primary oncologist at [facility], according to him patient is a multiple rounds of chemotherapy. #3 History of anxiety and agitation    I don't see any records in Epic if he was at [facility] 10 days ago. We will try to obtain records from [facility] . Patient is not stable for transfer to [facility] at this time. Discussed with the family when they arrive at 1 PM today.  All the records are reviewed and case discussed with Care Management/Social Workerr. Management plans discussed with the patient, family and they are in agreement.  CODE STATUS: full  TOTAL TIME TAKING CARE OF THIS PATIENT: 35  minutes.  Critical care time.  Condition critical, prognosis is poor.   Epifanio Lesches M.D on 09/16/2014 at 12:17 PM  Between 7am to 6pm - Pager - [phone]  After 6pm go to [email or web address] - password [email or web address] [facility]  [facility]  Office  [phone]  CC: Primary care physician; No primary care provider on file.

## 2014-09-16 NOTE — Procedures (Signed)
  Procedure Note: Central Venous Catheter Placement - R Femoral [name] , [id number] , IC18A/IC18A-AA  Indications: Hemodynamic monitoring / Intravenous access  Benefits, risks (including bleeding, infection,  Injury, etc.), and alternatives explained to [name] who voiced understanding.  Questions were sought and answered.  [name] agreed to proceed with the procedure.  Consent is signed and on chart. A time-out was completed verifying correct patient, procedure and site. A triple lumen catheter available at the time of procedure.  The patient was placed in a dependent position appropriate for central line placement based on the vein to be cannulated.  The patient's RIGHT groin was prepped and draped in a sterile fashion. 1% Lidocaine was used to anesthetize the surrounding skin area.  A triple lumen catheter was introduced into the RIGHT femoral vein using Seldinger technique under ultrasound guidance.  The catheter was threaded smoothly over the guide wire and appropriate blood return was obtained.  Each lumen of the catheter was evacuated of air and flushed with sterile saline.  The catheter was then sutured in place to the skin and a sterile dressing applied.  Perfusion to the extremity distal to the point of catheter insertion was checked and found to be adequate.    The patient tolerated the procedure well and there were no complications.  Vilinda Boehringer, MD [facility] Pager [phone] On Call Pager [phone]

## 2014-09-16 NOTE — Consult Note (Signed)
 [facility] Consult Note  MRN : [id number]  Steven Pineda is a 54 y.o. (29-Mar-1960) male who presents with chief complaint of  Chief Complaint  Patient presents with  . Shortness of Breath  .  History of Present Illness: The patient is a 54 y.o. male with a known history of stage IV pancreatic cancer, DVT, recent admission at [facility] 10 days ago with some infection and signed AMA was brought in by the EMS with the complaints of worsening of shortness of breath with hypoxia with room air saturations in the mid 80s. Patient was noted to be in severe respiratory distress on arrival to the ED and was placed on BiPAP. Workup revealed elevated white blood cell count of 43.3, BUN/creatinine 57/1.68, lactic acid 3.9, chest x-ray with near complete whiteout of the right hemithorax with a large pleural effusion and consolidation/atelectasis. EKG sinus tachycardia with ventricular rate of 1 27 bpm. his pulmonary status is deteriorated and he has subsequently been emergently intubated. Given his renal failure associated with the acidosis and hyperkalemia he is requiring emergent dialysis. Attempts another physician at placing a left femoral dialysis catheter were unsuccessful and I am now asked to secure appropriate emergent dialysis access. No further history is obtainable at this time.  Current Facility-Administered Medications  Medication Dose Route Frequency Provider Last Rate Last Dose  . acetaminophen (TYLENOL) tablet 650 mg  650 mg Oral Q6H PRN Juluis Mire, MD       Or  . acetaminophen (TYLENOL) suppository 650 mg  650 mg Rectal Q6H PRN Juluis Mire, MD      . dextrose 50 % solution 50 mL  1 ampule Intravenous Once Harmeet Singh, MD      . feeding supplement (VITAL HIGH PROTEIN) liquid 1,000 mL  1,000 mL Per Tube Q24H Vishal Mungal, MD   1,000 mL at 09/21/2014 1802  . fentaNYL (SUBLIMAZE) injection 100 mcg  100 mcg Intravenous Q2H PRN Vilinda Boehringer, MD   100  mcg at 09/16/14 0849  . free water 25 mL  25 mL Per Tube 6 times per day Vilinda Boehringer, MD   25 mL at 09/16/14 0420  . gabapentin (NEURONTIN) capsule 300 mg  300 mg Oral QHS Harmeet Singh, MD      . heparin injection 1,000-6,000 Units  1,000-6,000 Units CRRT PRN Harmeet Singh, MD      . insulin regular (NOVOLIN R,HUMULIN R) 100 units/mL injection 10 Units  10 Units Subcutaneous Once Harmeet Singh, MD      . ipratropium-albuterol (DUONEB) 0.5-2.5 (3) MG/3ML nebulizer solution 3 mL  3 mL Nebulization Q6H Vishal Mungal, MD   3 mL at 09/16/14 0748  . meropenem (MERREM) 1 g in sodium chloride 0.9 % 100 mL IVPB  1 g Intravenous Q12H Vishal Mungal, MD   1 g at 09/16/14 1259  . norepinephrine (LEVOPHED) 4mg  in D5W 266mL premix infusion  2-50 mcg/min Intravenous Continuous Vishal Mungal, MD 112.5 mL/hr at 09/16/14 1230 30 mcg/min at 09/16/14 1230  . ondansetron (ZOFRAN) tablet 4 mg  4 mg Oral Q6H PRN Juluis Mire, MD       Or  . ondansetron [facility]) injection 4 mg  4 mg Intravenous Q6H PRN Juluis Mire, MD      . oxyCODONE (Oxy IR/ROXICODONE) immediate release tablet 15 mg  15 mg Oral Q4H PRN Juluis Mire, MD      . pantoprazole (PROTONIX) injection 40 mg  40 mg Intravenous Q12H Vishal Mungal,  MD      . propofol (DIPRIVAN) 1000 MG/100ML infusion  5-70 mcg/kg/min Intravenous Titrated Vishal Mungal, MD 7.5 mL/hr at 09/16/14 1225 15 mcg/kg/min at 09/16/14 1225  . pureflow IV solution for Dialysis   CRRT Continuous Murlean Iba, MD 2,000 mL/hr at 09/16/14 1155    . senna (SENOKOT) tablet 8.6 mg  1 tablet Oral BID PRN Epifanio Lesches, MD      . sertraline (ZOLOFT) tablet 200 mg  200 mg Oral Daily Juluis Mire, MD   200 mg at 08/25/2014 1339  . sodium bicarbonate injection 50 mEq  50 mEq Intravenous Once Harmeet Singh, MD      . sodium chloride 0.9 % injection 3 mL  3 mL Intravenous Q12H Juluis Mire, MD   3 mL at 09/16/14 1000  . vancomycin (VANCOCIN) IVPB 1000 mg/200 mL premix  1,000 mg  Intravenous Q24H Murlean Iba, MD        Past Medical History  Diagnosis Date  . DVT (deep venous thrombosis)   . Pancreatic cancer     Past Surgical History  Procedure Laterality Date  . Hernia repair      Social History Social History  Substance Use Topics  . Smoking status: Former Research scientist (life sciences)  . Smokeless tobacco: Never Used  . Alcohol Use: None    Family History History reviewed. No pertinent family history. no family history of porphyria, autoimmune disease, bleeding or clotting disorders  Allergies  Allergen Reactions  . Penicillins Hives     REVIEW OF SYSTEMS: Review of systems is not obtainable given the patient is intubated and sedated and critically ill and family is not available.  Physical Examination  Filed Vitals:   09/16/14 1225 09/16/14 1230 09/16/14 1235 09/16/14 1240  BP:  78/56    Pulse: 107 107 107 107  Temp:      TempSrc:      Resp: 8 20 0 20  Height:      Weight:      SpO2: 90% 92% 97% 97%   Body mass index is 26.79 kg/(m^2). Gen:  WD/WN, critically ill Head: Bandana/AT, marked temporalis wasting. Prominent temp pulse not noted. Ear/Nose/Throat: nares w/o erythema or drainage, oropharynx ET tube present  Eyes: PERRLA Neck: Supple,  No bruit or JVD.  Pulmonary:  Intubated no breath sounds noted over the right lung fields.  Cardiac: Tachycardic, normal S1, S2, systolic Murmurs, no rubs or gallops. Vascular: Infuse-a-Port is noted right IJ clean appropriately accessed and intact. Gastrointestinal: soft, non-tender/non-distended.  Musculoskeletal:  Extremities without ischemic changes.  No deformity or atrophy. No edema. Neurologic: Intubated sedated unable to examine neurologic status Psychiatric: Intubated sedated unable to assess psychiatric status Dermatologic: No rashes or ulcers noted.  No cellulitis or open wounds. Lymph : No Cervical, Axillary, or Inguinal lymphadenopathy.    CBC Lab Results  Component Value Date   WBC 41.1*  09/16/2014   HGB 10.4* 09/16/2014   HCT 32.3* 09/16/2014   MCV 90.1 09/16/2014   PLT 285 09/16/2014    BMET    Component Value Date/Time   NA 135 09/16/2014 1113   NA 134* 09/16/2014 1113   K 6.5* 09/16/2014 1113   K 6.5* 09/16/2014 1113   CL 104 09/16/2014 1113   CL 105 09/16/2014 1113   CO2 19* 09/16/2014 1113   CO2 17* 09/16/2014 1113   GLUCOSE 154* 09/16/2014 1113   GLUCOSE 153* 09/16/2014 1113   BUN 76* 09/16/2014 1113   BUN 77* 09/16/2014 1113  CREATININE 2.80* 09/16/2014 1113   CREATININE 2.90* 09/16/2014 1113   CALCIUM 7.8* 09/16/2014 1113   CALCIUM 7.6* 09/16/2014 1113   GFRNONAA 24* 09/16/2014 1113   GFRNONAA 23* 09/16/2014 1113   GFRAA 28* 09/16/2014 1113   GFRAA 27* 09/16/2014 1113   Estimated Creatinine Clearance: 31.1 mL/min (by C-G formula based on Cr of 2.8).  COAG Lab Results  Component Value Date   INR 1.63 08/29/2014    Radiology Dg Abd 1 View  09/05/2014   CLINICAL DATA:  Enteric catheter placement.  Status post intubation.  EXAM: ABDOMEN - 1 VIEW  COMPARISON:  None.  FINDINGS: Limited view of the abdomen demonstrates enteric catheter overlying the expected location of gastric body, with side port 5 cm inferior to the expected location of gastro esophageal junction. Bowel gas pattern, where visualized, is nonobstructive. Again seen is complete opacification of the right hemithorax.  IMPRESSION: Enteric catheter placement, side port slightly inferior to the expected location of gastrointestinal junction.   Electronically Signed   By: Fidela Salisbury M.D.   On: 09/04/2014 13:12   Dg Chest Port 1 View  09/12/2014   CLINICAL DATA:  Right pleural effusion, status post right thoracentesis.  EXAM: PORTABLE CHEST - 1 VIEW  COMPARISON:  09/14/2014.  FINDINGS: A right pleural effusion has decreased in size. There appears to be a small to moderate right effusion present.  Right lower lung atelectasis is identified.  The cardiomediastinal silhouette is  unchanged.  An endotracheal tube with tip 3.2 cm above the carina, NG tube entering the stomach with tip off the field of view and right IJ Port-A-Cath with tip overlying the superior cavoatrial junction again noted. There is no evidence of pneumothorax.  IMPRESSION: Decreased right pleural effusion status post right thoracentesis. No evidence of pneumothorax.  Right lower lung atelectasis and support apparatus as described.   Electronically Signed   By: Margarette Canada M.D.   On:  15:47   Dg Chest Port 1 View     CLINICAL DATA:  Right posttherapy failure. Status post intubation and line placement.  EXAM: PORTABLE CHEST - 1 VIEW  COMPARISON:  09/01/2014  FINDINGS: There has been interval intubation with the endotracheal tube overlying the tracheal air column and terminating 3.3 cm superior to the carina. Right-sided central venous catheter has been placed, tip overlying the expected location of cavoatrial junction. Enteric catheter transverses the thorax, tip collimated off the image.  Cardiomediastinal silhouette and mediastinal contours are partially obscured by complete opacification of the right hemithorax.  The left lung is clear.  There is no evidence of pneumothorax.  Osseous structures are without acute abnormality. Soft tissues are grossly normal.  IMPRESSION: Status post intubation, endotracheal tube 3.3 cm superior to the carina.  New complete opacification of the right hemithorax, slightly worse than on the prior radiograph.   Electronically Signed   By: Fidela Salisbury M.D.   On: 09/10/2014 13:09   Dg Chest Port 1 View  08/26/2014   CLINICAL DATA:  Shortness of breath. Recent treatment for infection. Decreased oxygen saturation.  EXAM: PORTABLE CHEST - 1 VIEW  COMPARISON:  None.  FINDINGS: Near complete white out of the right hemi thorax with aeration only of the right apex. This may indicate a large pleural effusion and/or consolidation or atelectasis on the right. Left lung  is clear. Heart size as visualized appears normal. No pneumothorax. Right-sided Infuse-A-Port with tip over the low SVC region.  IMPRESSION: Near complete white out of the right  hemi thorax consistent with large pleural effusion and/or consolidation or atelectasis.   Electronically Signed   By: Lucienne Capers M.D.   On: 09/08/2014 03:18   US Thoracentesis Asp Pleural Space W/img Guide  09/13/2014   INDICATION: History of stage IV pancreatic cancer, now with large symptomatic right-sided pleural effusion. Please perform ultrasound-guided right-sided thoracentesis for diagnostic and therapeutic purposes.  EXAM: US THORACENTESIS ASP PLEURAL SPACE W/IMG GUIDE  COMPARISON:  Chest radiograph -08/26/2014  MEDICATIONS: None  COMPLICATIONS: None immediate  TECHNIQUE: Informed written consent was obtained from the patient's family after a discussion of the risks, benefits and alternatives to treatment. A timeout was performed prior to the initiation of the procedure.  Initial ultrasound scanning demonstrates a large right-sided pleural effusion which is noted to contain multiple internal low level echoes. The antero lateral aspect of the right lower chest was prepped and draped in the usual sterile fashion. 1% lidocaine was used for local anesthesia. An ultrasound image was saved for documentation purposes. An 8 Fr Safe-T-Centesis catheter was introduced. The thoracentesis was performed. The catheter was removed and a dressing was applied. The patient tolerated the procedure well without immediate post procedural complication.  FINDINGS: A total of approximately 1.8 liters of blood tinged serous fluid was removed. Requested samples were sent to the laboratory.  As this was the patient's first thoracentesis, the thoracentesis was stopped following the acquisition of 1.8 L of fluid though additional fluid remains.  IMPRESSION: Successful ultrasound-guided right sided thoracentesis yielding 1.8 liters of blood tinged serous  pleural fluid.   Electronically Signed   By: Sandi Mariscal M.D.   On: 09/03/2014 16:05    Assessment/Plan 1. Acute renal failure with acidosis and hyperkalemia patient will require emergent dialysis and unsuccessful attempt has been made at securing a temporary access I will therefore place the catheter. The right IJ will be selected and I'll perform a stick with a micropuncture needle several centimeters above the easily visible and palpable Infuse-a-Port. Ultrasound will be utilized as well. 2. Acute respiratory failure with hypoxia secondary to combination of right-sided pleural effusion and possible right-sided pneumonia. He is now critically ill he is intubated he is on antibiotics 3. Sepsis, possible pneumonia he is currently on multiple antibiotics. 4. Stage IV pancreatic cancer, undergoing chemotherapy at [facility] oncology will be consult at. 5.  Right-sided pleural effusion, infectious versus malignant plans are to tap the effusion and this will be performed after his dialysis access has been secured.    Hamp Moreland, Dolores Lory, MD  09/16/2014 2:34 PM

## 2014-09-16 NOTE — Progress Notes (Signed)
BS thoracentesis done, 2.3L retreived bloody fluid by MD Nyoka Cowden, pt tol fair,

## 2014-09-16 NOTE — Progress Notes (Addendum)
 PULMONARY / CRITICAL CARE MEDICINE   Name: Steven Pineda MRN: [id number] DOB: 09-Aug-1960    ADMISSION DATE:  08/25/2014 CONSULTATION DATE:  09/01/2014  REFERRING MD :  Dr. Reece Levy   CHIEF COMPLAINT:     Short of breath and confusion     SUBJECTIVE: Patient intubated and sedated on the vent. Worsening hypotension with acute renal failure, requiring CVL, Arterial line, vas cath placement and pressors.  S\P thoracentesis, concern for hemothorax and possible empyema, but h/h stable overnight. [name] ([place]) informed and updated on status of patient.  Significant Events 8/22>>intubated (respiratory distress, confusion, and AMS) 8/23>>CVL, Arterial line, Vas Cath placement, CRRT  HISTORY OF PRESENT ILLNESS    54 year old male past medical history of stage IV pancreatic cancer, history of DVT, recent admission at [facility] 10 days ago for a suspected "infection", sign out AMA, admitted for worsening shortness of breath with hypoxia and worsening confusion over the past 2-3 days. Patient is noted to be altered and confused on BiPAP at the time of evaluation. History obtained from next of kin who is an aunt, who stated that patient has a history of stage IV pancreatic cancer, sign out AMA from [facility] as stated above against the family wishes. He's been having worsening confusion over the last 2-3 days along with labored breathing, and which point EMS was called as stated above. Per arrival to the ICU is noted to be on BiPAP, with severe agitation, received 2 mg of Ativan, then became somnolent, and is becoming more awake however with moderate to severe confusion and altered mental status, his ABG showed mild metabolic acidosis. Chest x-ray with significant pleural effusion and white count, upon my evaluation patient's noted to be moderately overweight but severely confused, slurred speech, increase use of accessory muscles. I have spoken to his aunt, [name], who stated that he is a full  code.       PAST MEDICAL HISTORY    :  Past Medical History  Diagnosis Date  . DVT (deep venous thrombosis)   . Pancreatic cancer    Past Surgical History  Procedure Laterality Date  . Hernia repair     Prior to Admission medications   Medication Sig Start Date End Date Taking? Authorizing Provider  ciprofloxacin (CIPRO) 500 MG tablet Take 1 tablet by mouth 2 (two) times daily. 09/01/14  Yes Historical Provider, MD  gabapentin (NEURONTIN) 300 MG capsule Take 1 capsule by mouth 3 (three) times daily. 08/13/14  Yes Historical Provider, MD  LORazepam (ATIVAN) 2 MG tablet Take 1 tablet by mouth 3 (three) times daily. 08/18/14  Yes Historical Provider, MD  oxyCODONE (ROXICODONE) 15 MG immediate release tablet Take 1 tablet by mouth every 4 (four) hours as needed. 08/29/14  Yes Historical Provider, MD  sertraline (ZOLOFT) 100 MG tablet Take 2 tablets by mouth daily. 08/18/14  Yes Historical Provider, MD   Allergies  Allergen Reactions  . Penicillins Hives     FAMILY HISTORY   History reviewed. No pertinent family history.    SOCIAL HISTORY    reports that he has quit smoking. He has never used smokeless tobacco. He reports that he does not use illicit drugs. His alcohol history is not on file.  ROS difficult to obtain, patient with altered mental status and on BiPAP    VITAL SIGNS    Temp:  [97.6 F (36.4 C)-100.6 F (38.1 C)] 100.6 F (38.1 C) (08/23 0500) Pulse Rate:  [109-129] 128 (08/23 0700) Resp:  [7-36]  21 (08/23 0700) BP: (76-146)/(61-86) 84/61 mmHg (08/23 0700) SpO2:  [90 %-97 %] 95 % (08/23 0748) FiO2 (%):  [40 %-60 %] 50 % (08/23 0920) Weight:  [186 lb 11.7 oz (84.7 kg)] 186 lb 11.7 oz (84.7 kg) (08/22 1500) HEMODYNAMICS:   VENTILATOR SETTINGS: Vent Mode:  [-] PRVC FiO2 (%):  [40 %-60 %] 50 % Set Rate:  [12 bmp-20 bmp] 20 bmp Vt Set:  [450 mL] 450 mL PEEP:  [5 cmH20] 5 cmH20 Plateau Pressure:  [20 cmH20] 20 cmH20 INTAKE / OUTPUT:  Intake/Output  Summary (Last 24 hours) at 09/16/14 1129 Last data filed at 09/16/14 0500  Gross per 24 hour  Intake 515.99 ml  Output    430 ml  Net  85.99 ml       PHYSICAL EXAM   Physical Exam HENT:  Head: Normocephalic and atraumatic.  Right Ear: External ear normal.  Left Ear: External ear normal.  Nose: Nose normal.  Eyes: pupils sluggish, on sedation Neck: Normal range of motion. Neck supple. No JVD present. No thyromegaly present.  Cardiovascular: Normal rate, regular rhythm, normal heart sounds and intact distal pulses. Exam reveals no friction rub.  No murmur heard. Respiratory: on MV, dec BS on the right, coarse upper airway sounds.  GI: Bowel sounds are normal.  Musculoskeletal: no lesions Lymphadenopathy:   He has no cervical adenopathy.  Neurological: unable to assess, on vent intubated and sedated    LABS   LABS:  CBC  Recent Labs Lab 09/24/2014 0249 09/20/2014 1808 09/16/14 0716  WBC 43.3*  --  41.1*  HGB 10.0* 9.9* 10.4*  HCT 30.9* 31.6* 32.3*  PLT 365  --  285   Coag's  Recent Labs Lab 09/19/2014 1240  APTT 33  INR 1.63   BMET  Recent Labs Lab 09/08/2014 0249 09/24/2014 1343 09/16/14 0716  NA 133* 134* 135  K 4.9 5.2* 6.2*  CL 101 103 105  CO2 18* 20* 17*  BUN 57* 60* 75*  CREATININE 1.68* 1.61* 2.57*  GLUCOSE 118* 144* 129*   Electrolytes  Recent Labs Lab 09/14/2014 0249 09/14/2014 1343 09/16/14 0716  CALCIUM 7.8* 7.8* 8.0*   Sepsis Markers  Recent Labs Lab 09/07/2014 0249  LATICACIDVEN 3.9*   ABG  Recent Labs Lab 09/13/2014 0247 09/03/2014 1330 09/16/14 0750  PHART 7.35 7.08* 7.35  PCO2ART 33 67* 26*  PO2ART 76* 78* 73*   Liver Enzymes  Recent Labs Lab 08/29/2014 1343 09/16/14 0716  AST 270* 782*  ALT 105* 206*  ALKPHOS 393* 468*  BILITOT 3.4* 4.2*  ALBUMIN 2.3* 2.1*   Cardiac Enzymes No results for input(s): TROPONINI, PROBNP in the last 168 hours. Glucose  Recent Labs Lab 09/01/2014 1623 08/26/2014 2136  09/05/2014 2352 09/16/14 0358 09/16/14 0718  GLUCAP 165* 116* 126* 131* 125*     Recent Results (from the past 240 hour(s))  Blood culture (routine x 2)     Status: None (Preliminary result)   Collection Time: 08/26/2014  4:08 AM  Result Value Ref Range Status   Specimen Description BLOOD LEFT ARM  Final   Special Requests BOTTLES DRAWN AEROBIC AND ANAEROBIC 5CC  Final   Culture NO GROWTH 1 DAY  Final   Report Status PENDING  Incomplete  Blood culture (routine x 2)     Status: None (Preliminary result)   Collection Time:   4:08 AM  Result Value Ref Range Status   Specimen Description BLOOD RIGHT  Final   Special Requests BOTTLES DRAWN AEROBIC AND  ANAEROBIC 5CC  Final   Culture NO GROWTH 1 DAY  Final   Report Status PENDING  Incomplete  MRSA PCR Screening     Status: None   Collection Time: 09/04/2014 12:30 PM  Result Value Ref Range Status   MRSA by PCR NEGATIVE NEGATIVE Final    Comment:        The GeneXpert MRSA Assay (FDA approved for NASAL specimens only), is one component of a comprehensive MRSA colonization surveillance program. It is not intended to diagnose MRSA infection nor to guide or monitor treatment for MRSA infections.      Current facility-administered medications:  .  acetaminophen (TYLENOL) tablet 650 mg, 650 mg, Oral, Q6H PRN **OR** acetaminophen (TYLENOL) suppository 650 mg, 650 mg, Rectal, Q6H PRN, Juluis Mire, MD .  feeding supplement (VITAL HIGH PROTEIN) liquid 1,000 mL, 1,000 mL, Per Tube, Q24H, Atharva Mirsky, MD, 1,000 mL at 09/09/2014 1802 .  fentaNYL (SUBLIMAZE) injection 100 mcg, 100 mcg, Intravenous, Q2H PRN, Vilinda Boehringer, MD, 100 mcg at 09/16/14 0849 .  free water 25 mL, 25 mL, Per Tube, 6 times per day, Vilinda Boehringer, MD, 25 mL at 09/16/14 0420 .  gabapentin (NEURONTIN) capsule 300 mg, 300 mg, Oral, QHS, Harmeet Singh, MD .  heparin injection 1,000-6,000 Units, 1,000-6,000 Units, CRRT, PRN, Harmeet Singh, MD .  ipratropium-albuterol  (DUONEB) 0.5-2.5 (3) MG/3ML nebulizer solution 3 mL, 3 mL, Nebulization, Q6H, Carma Dwiggins, MD, 3 mL at 09/16/14 0748 .  levofloxacin (LEVAQUIN) IVPB 500 mg, 500 mg, Intravenous, Q24H, Harmeet Singh, MD .  norepinephrine (LEVOPHED) 4mg  in D5W 253mL premix infusion, 2-50 mcg/min, Intravenous, Continuous, Risha Barretta, MD, Last Rate: 67.5 mL/hr at 09/16/14 1050, 18 mcg/min at 09/16/14 1050 .  ondansetron (ZOFRAN) tablet 4 mg, 4 mg, Oral, Q6H PRN **OR** ondansetron (ZOFRAN) injection 4 mg, 4 mg, Intravenous, Q6H PRN, Juluis Mire, MD .  oxyCODONE (Oxy IR/ROXICODONE) immediate release tablet 15 mg, 15 mg, Oral, Q4H PRN, Juluis Mire, MD .  pantoprazole (PROTONIX) EC tablet 40 mg, 40 mg, Oral, Daily, Juluis Mire, MD, 40 mg at 09/11/2014 1337 .  propofol (DIPRIVAN) 1000 MG/100ML infusion, 5-70 mcg/kg/min, Intravenous, Titrated, Kaoru Benda, MD, Last Rate: 10 mL/hr at 09/16/14 1108, 20 mcg/kg/min at 09/16/14 1108 .  pureflow IV solution for Dialysis, , CRRT, Continuous, Murlean Iba, MD .  senna (SENOKOT) tablet 8.6 mg, 1 tablet, Oral, BID PRN, Epifanio Lesches, MD .  sertraline (ZOLOFT) tablet 200 mg, 200 mg, Oral, Daily, Juluis Mire, MD, 200 mg at 09/11/2014 1339 .  sodium chloride 0.9 % injection 3 mL, 3 mL, Intravenous, Q12H, Juluis Mire, MD, 3 mL at 09/09/2014 2151 .  vancomycin (VANCOCIN) IVPB 1000 mg/200 mL premix, 1,000 mg, Intravenous, Q24H, Murlean Iba, MD  IMAGING    Dg Abd 1 View  08/27/2014   CLINICAL DATA:  Enteric catheter placement.  Status post intubation.  EXAM: ABDOMEN - 1 VIEW  COMPARISON:  None.  FINDINGS: Limited view of the abdomen demonstrates enteric catheter overlying the expected location of gastric body, with side port 5 cm inferior to the expected location of gastro esophageal junction. Bowel gas pattern, where visualized, is nonobstructive. Again seen is complete opacification of the right hemithorax.  IMPRESSION: Enteric catheter placement, side  port slightly inferior to the expected location of gastrointestinal junction.   Electronically Signed   By: Fidela Salisbury M.D.   On: 09/11/2014 13:12   Dg Chest Port 1 View  08/29/2014   CLINICAL DATA:  Right pleural effusion, status post right thoracentesis.  EXAM: PORTABLE CHEST - 1 VIEW  COMPARISON:  09/24/2014.  FINDINGS: A right pleural effusion has decreased in size. There appears to be a small to moderate right effusion present.  Right lower lung atelectasis is identified.  The cardiomediastinal silhouette is unchanged.  An endotracheal tube with tip 3.2 cm above the carina, NG tube entering the stomach with tip off the field of view and right IJ Port-A-Cath with tip overlying the superior cavoatrial junction again noted. There is no evidence of pneumothorax.  IMPRESSION: Decreased right pleural effusion status post right thoracentesis. No evidence of pneumothorax.  Right lower lung atelectasis and support apparatus as described.   Electronically Signed   By: Margarette Canada M.D.   On: 09/19/2014 15:47   Dg Chest Port 1 View  09/18/2014   CLINICAL DATA:  Right posttherapy failure. Status post intubation and line placement.  EXAM: PORTABLE CHEST - 1 VIEW  COMPARISON:  09/16/2014  FINDINGS: There has been interval intubation with the endotracheal tube overlying the tracheal air column and terminating 3.3 cm superior to the carina. Right-sided central venous catheter has been placed, tip overlying the expected location of cavoatrial junction. Enteric catheter transverses the thorax, tip collimated off the image.  Cardiomediastinal silhouette and mediastinal contours are partially obscured by complete opacification of the right hemithorax.  The left lung is clear.  There is no evidence of pneumothorax.  Osseous structures are without acute abnormality. Soft tissues are grossly normal.  IMPRESSION: Status post intubation, endotracheal tube 3.3 cm superior to the carina.  New complete opacification of the  right hemithorax, slightly worse than on the prior radiograph.   Electronically Signed   By: Fidela Salisbury M.D.   On: 09/02/2014 13:09   US Thoracentesis Asp Pleural Space W/img Guide  09/21/2014   INDICATION: History of stage IV pancreatic cancer, now with large symptomatic right-sided pleural effusion. Please perform ultrasound-guided right-sided thoracentesis for diagnostic and therapeutic purposes.  EXAM: US THORACENTESIS ASP PLEURAL SPACE W/IMG GUIDE  COMPARISON:  Chest radiograph -09/02/2014  MEDICATIONS: None  COMPLICATIONS: None immediate  TECHNIQUE: Informed written consent was obtained from the patient's family after a discussion of the risks, benefits and alternatives to treatment. A timeout was performed prior to the initiation of the procedure.  Initial ultrasound scanning demonstrates a large right-sided pleural effusion which is noted to contain multiple internal low level echoes. The antero lateral aspect of the right lower chest was prepped and draped in the usual sterile fashion. 1% lidocaine was used for local anesthesia. An ultrasound image was saved for documentation purposes. An 8 Fr Safe-T-Centesis catheter was introduced. The thoracentesis was performed. The catheter was removed and a dressing was applied. The patient tolerated the procedure well without immediate post procedural complication.  FINDINGS: A total of approximately 1.8 liters of blood tinged serous fluid was removed. Requested samples were sent to the laboratory.  As this was the patient's first thoracentesis, the thoracentesis was stopped following the acquisition of 1.8 L of fluid though additional fluid remains.  IMPRESSION: Successful ultrasound-guided right sided thoracentesis yielding 1.8 liters of blood tinged serous pleural fluid.   Electronically Signed   By: Sandi Mariscal M.D.   On: 09/23/2014 16:05      Indwelling Urinary Catheter continued, requirement due to   Reason to continue Indwelling Urinary  Catheter for strict Intake/Output monitoring for hemodynamic instability   Central Line continued, requirement due to   Reason to continue [employer]  Monitoring of central venous pressure or other hemodynamic parameters   Ventilator continued, requirement due to, resp failure    Ventilator Sedation RASS 0 to -2      ASSESSMENT/PLAN  54 year old male past medical history of stage IV pancreatic cancer, followed at [facility], history of DVT on lovenox.   PULMONARY  A: Acute hypoxic respiratory failure  Right-sided pleural effusion ??RLL PNA P:   Secondary to right-sided pleural effusion, muscle fatigue - intubated 8/22 His pleural effusion had a bloody color, and LDH is 4276, concerning for a empyema, CT surg is following and will discuss with family about surgical procedure. Cont to monitor H/H MV, SBT daily, sedation vacation Patient with decompensation over the past 24 hours with renal failure, hypotension, now requiring CRRT and has multiorgan system failure (MSOF).  Plan for another U/S thoracentesis today and recheck labs.   CARDIOVASCULAR Hypotensive - requiring pressors Check ECHO Cont with hemodynamic support  Check CE MAP >65  RENAL A:Elevated creatinine - worsening hyperkalemia P: Unknown baseline creatinine at this time - plan for CRRT Nephrology following Gentle IV fluids Electrolyte ICU protocol replacement  GASTROINTESTINAL Elevated LFTs Elevated ammonia Plan: Check U/S abdomen Mostly likely related to MSOF and shock (septic)    HEMATOLOGIC A: Stage IV pancreatic cancer leukocytosis P: Monitor H&H and CBC Hematology oncology consult   INFECTIOUS A: sepsis - severe with MSOF P:   Source - ?? Right pleural effusion, ??empyema, ??HCAP Patient was been treated at [facility] 2 wks ago, by report per aunt, left AMA Check abd u/s Will plan to drain pleural fluid and consider a CT Chest in 1-2 days  - adjust Abx to vanc and mero    Micro Bld Cx  8/22 Sputum Cx Urine Cx Pleural Cx 8/22  ABx Vanc 8/22>> Levaquin 8/22>>8/23 Meropenem 8/23>>  Lines: Right Femoral CVL 8/23>> Right Femoral arterial line 8/23>> Left Femoral vas cath 8/23>>  ENDOCRINE ICU Hypoglycemia/hyperglycemia protocol  NEUROLOGIC A: intubated and sedated P:   RASS goal: 0 to -1 while on MV   Social: FULL CODE - spoke with POA (Aunt, [name]), updated her on patient clinical status and management plan.  - palliative care consult to discuss goal of care - per heme/onc may need to transfer to [facility] to cont Pancreatic Ca treatment per there protocol.   I have personally obtained a history, examined the patient, evaluated laboratory and imaging results, formulated the assessment and plan and placed orders. CRITICAL CARE: The patient is critically ill with multiple organ systems failure and requires high complexity decision making for assessment and support, frequent evaluation and titration of therapies, application of advanced monitoring technologies and extensive interpretation of multiple databases. Critical Care Time devoted to patient care services described in this note is 90 minutes.    Vilinda Boehringer, MD [facility] Pager [phone] (Please enter 7-digits)      09/16/2014, 11:29 AM

## 2014-09-16 NOTE — Progress Notes (Signed)
Time out called by Dr. Stevenson Clinch for central line placement and arterial line placement. Per Dr. Stevenson Clinch he spoke with Shirlee Limerick the [place] that was listed as next of kin.

## 2014-09-16 NOTE — Progress Notes (Signed)
[place] NOTE  Pharmacy Consult for renal dose adjustment for CRRT   Allergies  Allergen Reactions  . Penicillins Hives    Labs:  Recent Labs  09/11/2014 0249 09/16/2014 1240 09/18/2014 1343 08/26/2014 1808 09/16/14 0716  WBC 43.3*  --   --   --  41.1*  HGB 10.0*  --   --  9.9* 10.4*  HCT 30.9*  --   --  31.6* 32.3*  PLT 365  --   --   --  285  APTT  --  33  --   --   --   INR  --  1.63  --   --   --   CREATININE 1.68*  --  1.61*  --  2.57*  ALBUMIN  --   --  2.3*  --  2.1*  PROT  --   --  6.5  --  6.2*  AST  --   --  270*  --  782*  ALT  --   --  105*  --  206*  ALKPHOS  --   --  393*  --  468*  BILITOT  --   --  3.4*  --  4.2*   Estimated Creatinine Clearance: 33.9 mL/min (by C-G formula based on Cr of 2.57).   Recent Labs  09/04/2014 2352 09/16/14 0358 09/16/14 0718  GLUCAP 126* 131* 125*    Microbiology: Recent Results (from the past 720 hour(s))  Blood culture (routine x 2)     Status: None (Preliminary result)   Collection Time: 09/14/2014  4:08 AM  Result Value Ref Range Status   Specimen Description BLOOD LEFT ARM  Final   Special Requests BOTTLES DRAWN AEROBIC AND ANAEROBIC 5CC  Final   Culture NO GROWTH 1 DAY  Final   Report Status PENDING  Incomplete  Blood culture (routine x 2)     Status: None (Preliminary result)   Collection Time: 09/21/2014  4:08 AM  Result Value Ref Range Status   Specimen Description BLOOD RIGHT  Final   Special Requests BOTTLES DRAWN AEROBIC AND ANAEROBIC 5CC  Final   Culture NO GROWTH 1 DAY  Final   Report Status PENDING  Incomplete  MRSA PCR Screening     Status: None   Collection Time: 08/31/2014 12:30 PM  Result Value Ref Range Status   MRSA by PCR NEGATIVE NEGATIVE Final    Comment:        The GeneXpert MRSA Assay (FDA approved for NASAL specimens only), is one component of a comprehensive MRSA colonization surveillance program. It is not intended to diagnose MRSA infection nor to guide or monitor  treatment for MRSA infections.     Assessment: Pharmacy consulted to adjust medications for CRRT in this 54 year old male admitted to CCU.  Goal of Therapy:  Monitor and adjust medications daily. Vancomycin target trough: 15-31mcg/ml  Plan:  All orders reviewed. Will make the following changes per protocol:  Gabapentin 300mg  PO TID ordered, changed to gabapeting 300mg  PO QHS.  Levaquin 750mg  IV Q24H ordered, changed to levaquin 500mg  IV Q24H.  Vancomycin 1gm IV Q12H ordered, changed to vancomycin 1gm IV Q24H. Will check trough prior to third dose.  No other changes at this time. Pharmacy to follow per consult  Rexene Edison, PharmD Clinical Pharmacist  09/16/2014,10:40 AM

## 2014-09-16 NOTE — Progress Notes (Addendum)
   09/16/14 1633  Clinical Encounter Type  Visited With Patient;Health care provider  Visit Type Spiritual support  Referral From Nurse  Consult/Referral To Chaplain  Spiritual Encounters  Spiritual Needs Emotional;Other (Comment)  Stress Factors  Patient Stress Factors Health changes  Chaplain rounded in unit when an order came in for this patient. Stopped by earlier to check in on patient but the patient was undergoing a treatment/procedure. Spoke with the nurse regarding consult and the nurse asked if we could provide spiritual support to the patient/patient's family as the patient's health is declining. Family is expected to return on Thursday so I will continue to monitor and support patient and family as applicable. Will also refer to other unit chaplain as well as on-call chaplain. Chaplain Guida Asman A. Kammie Scioli Ext. [phone]

## 2014-09-16 NOTE — Progress Notes (Signed)
Dr. Carroll Kinds notified that vascular catheter needs to be evaluated via staff [place] Sousa) in [place].  Gerald Stabs called back and verbalized via phone that Dr. Carroll Kinds would come see patient after the case he was involved with.  No time frame given.

## 2014-09-16 NOTE — Progress Notes (Signed)
CRRT with high venous pressures.  Nurse attempted to flush and switch ports to allow for better blood flow with no success.  Dr. Stevenson Clinch notified.  Per Dr. Stevenson Clinch have vascular MD to evaluate line as he had issues with placement.  Dr. Candiss Norse called and notified that CRRT had to be stopped and that patient continues to have elevated potassium.  Order to be placed by Dr. Candiss Norse.

## 2014-09-16 NOTE — Progress Notes (Signed)
ANTIBIOTIC CONSULT NOTE - INITIAL  Pharmacy Consult for Vancomycin/Meropenem Indication: sepsis/rule out pneumonia  Allergies  Allergen Reactions  . Penicillins Hives    Patient Measurements: Height: 5\' 10"  (177.8 cm) Weight: 186 lb 11.7 oz (84.7 kg) IBW/kg (Calculated) : 73   Vital Signs: Temp: 98 F (36.7 C) (08/23 1136) Temp Source: Oral (08/23 0500) BP: 84/61 mmHg (08/23 0700) Pulse Rate: 128 (08/23 0700) Intake/Output from previous day: 08/22 0701 - 08/23 0700 In: 516 [I.V.:116; IV Piggyback:400] Out: 430 [Urine:430] Intake/Output from this shift:    Labs:  Recent Labs  09/21/2014 0249 08/28/2014 1343 09/06/2014 1808 09/16/14 0716 09/16/14 1113  WBC 43.3*  --   --  41.1*  --   HGB 10.0*  --  9.9* 10.4*  --   PLT 365  --   --  285  --   CREATININE 1.68* 1.61*  --  2.57* 2.80*   Estimated Creatinine Clearance: 31.1 mL/min (by C-G formula based on Cr of 2.8). No results for input(s): VANCOTROUGH, VANCOPEAK, VANCORANDOM, GENTTROUGH, GENTPEAK, GENTRANDOM, TOBRATROUGH, TOBRAPEAK, TOBRARND, AMIKACINPEAK, AMIKACINTROU, AMIKACIN in the last 72 hours.    Kinetics:   Ke: 0.05 Vd: 58.6  Microbiology: Recent Results (from the past 720 hour(s))  Blood culture (routine x 2)     Status: None (Preliminary result)   Collection Time: 08/28/2014  4:08 AM  Result Value Ref Range Status   Specimen Description BLOOD LEFT ARM  Final   Special Requests BOTTLES DRAWN AEROBIC AND ANAEROBIC 5CC  Final   Culture NO GROWTH 1 DAY  Final   Report Status PENDING  Incomplete  Blood culture (routine x 2)     Status: None (Preliminary result)   Collection Time: 09/11/2014  4:08 AM  Result Value Ref Range Status   Specimen Description BLOOD RIGHT  Final   Special Requests BOTTLES DRAWN AEROBIC AND ANAEROBIC 5CC  Final   Culture NO GROWTH 1 DAY  Final   Report Status PENDING  Incomplete  MRSA PCR Screening     Status: None   Collection Time: 09/10/2014 12:30 PM  Result Value Ref Range Status    MRSA by PCR NEGATIVE NEGATIVE Final    Comment:        The GeneXpert MRSA Assay (FDA approved for NASAL specimens only), is one component of a comprehensive MRSA colonization surveillance program. It is not intended to diagnose MRSA infection nor to guide or monitor treatment for MRSA infections.   Body fluid culture     Status: None (Preliminary result)   Collection Time: 09/03/2014  2:21 PM  Result Value Ref Range Status   Specimen Description PLEURAL  Final   Special Requests NONE  Final   Gram Stain PENDING  Incomplete   Culture NO GROWTH 1 DAY  Final   Report Status PENDING  Incomplete    Medical History: Past Medical History  Diagnosis Date  . DVT (deep venous thrombosis)   . Pancreatic cancer     Medications:  Scheduled:  . feeding supplement (VITAL HIGH PROTEIN)  1,000 mL Per Tube Q24H  . free water  25 mL Per Tube 6 times per day  . gabapentin  300 mg Oral QHS  . ipratropium-albuterol  3 mL Nebulization Q6H  . meropenem (MERREM) IV  1 g Intravenous Q12H  . pantoprazole  40 mg Oral Daily  . sertraline  200 mg Oral Daily  . sodium chloride  1,000 mL Intravenous Once  . sodium chloride  3 mL Intravenous Q12H  .  vancomycin  1,000 mg Intravenous Q24H   Infusions:  . norepinephrine 18 mcg/min (09/16/14 1050)  . propofol (DIPRIVAN) infusion 20 mcg/kg/min (09/16/14 1108)  . pureflow 2,000 mL/hr at 09/16/14 1155   PRN: acetaminophen **OR** acetaminophen, fentaNYL (SUBLIMAZE) injection, heparin, ondansetron **OR** ondansetron (ZOFRAN) IV, oxyCODONE, senna  Assessment: 54 y/o M with h/o stage IV pancreatic CA admitted with acute respiratory failure, sepsis, and possible PNA.   MD wants to broaden antibiotic coverage so will d/c Levaquin and start meropenem (patient with PCN allergy).    Goal of Therapy:  Vancomycin trough level 15-20 mcg/ml  Plan:  Vancomycin dosing changed to 1000 mg iv q 24 hours due to CRRT. Trough scheduled with the third dose of this new  regimen.   Will order meropenem 1 g iv q 12 hours.   Will f/u renal function and culture results.   Ulice Dash D 09/16/2014,12:41 PM

## 2014-09-16 NOTE — Op Note (Signed)
  OPERATIVE NOTE   PROCEDURE: 1. Insertion of temporary dialysis catheter catheter right IJ approach.  PRE-OPERATIVE DIAGNOSIS: Acute renal failure  POST-OPERATIVE DIAGNOSIS: Same  SURGEON: Katha Cabal M.D.  ANESTHESIA: 1% lidocaine local infiltration  ESTIMATED BLOOD LOSS: Minimal cc  INDICATIONS:   Steven Pineda is a 54 y.o. male who presents with complete whiteout of his right long he is required intubation and ventilation for support. He is now in acute renal failure with severe acidosis and hyperkalemia and therefore is requiring emergent dialysis. I am asked to place a temporary dialysis catheter.  DESCRIPTION: After obtaining full informed written consent, the patient was positioned supine. The right neck was prepped and draped in a sterile fashion. Ultrasound was placed in a sterile sleeve. Ultrasound was utilized to identify the right internal jugular vein which is noted to be echolucent and compressible indicating patency. Images recorded for the permanent record. Under real-time visualization a micropuncture needle is inserted into the vein and the microwire is advanced followed by the micro-sheath. The J-wire is then advanced without difficulty. Small counterincision was made at the wire insertion site. Dilator is passed over the wire and the temporary dialysis catheter catheter is fed over the wire without difficulty.  All lumens aspirate and flush easily and are packed with heparin saline. Catheter secured to the skin of the neck with 2-0 silk. A sterile dressing is applied with Biopatch.  COMPLICATIONS: None  CONDITION: Unchanged  Katha Cabal, M.D. [facility]. Office:  [phone]

## 2014-09-16 NOTE — Progress Notes (Addendum)
CRRT (CVVHD started per policy and procedure via left groin temporary vascular catheter.

## 2014-09-16 NOTE — Consult Note (Signed)
 Date: 09/16/2014                  Patient Name:  Steven Pineda  MRN: [id number]  DOB: [birth year]  Age / Sex: 54 y.o., male         PCP: No primary care provider on file.                 Service Requesting Consult: Internal medicine                 Reason for Consult: ARF, hyperkalemia            History of Present Illness: Patient is a 54 year old gentleman with reported history of adenocarcinoma the pancreas. He has been undergoing chemotherapy at [facility], it is unclear when his last treatment was. He recently was admitted to [facility] with altered mental status and fever but subsequently left AMA. He presented to [facility] with increasing shortness of breath and a new right pleural effusion. He was intubated and sedated and underwent thoracentesis to assess whether his pleural effusion is malignant. No family is at bedside. Patient presented to the emergency room via EMS. He was placed on BiPAP. His chest x-ray showed white out of the right hemothorax. Patient underwent ultrasound-guided right sided thoracentesis yielding 1.8 liters of blood tinged serous pleural fluid At the time of admission, patient's creatinine was 1.61, GFR 47 His urine output has been extremely poor Today's creatinine has increased to 2.57. Potassium level has increased to 6.2. AST, ALT are significantly elevated. Albumin level is low. Bilirubin is high. WBC count is significantly elevated at 41.1   Medications: Outpatient medications: Prescriptions prior to admission  Medication Sig Dispense Refill Last Dose  . ciprofloxacin (CIPRO) 500 MG tablet Take 1 tablet by mouth 2 (two) times daily.  0 Past Month at Unknown time  . gabapentin (NEURONTIN) 300 MG capsule Take 1 capsule by mouth 3 (three) times daily.  0 Past Month at Unknown time  . LORazepam (ATIVAN) 2 MG tablet Take 1 tablet by mouth 3 (three) times daily.  2 Past Month at Unknown time  . oxyCODONE (ROXICODONE) 15 MG immediate release tablet Take 1 tablet by  mouth every 4 (four) hours as needed.  0 09/14/2014 at Unknown time  . sertraline (ZOLOFT) 100 MG tablet Take 2 tablets by mouth daily.  2 09/14/2014 at Unknown time    Current medications: Current Facility-Administered Medications  Medication Dose Route Frequency Provider Last Rate Last Dose  . acetaminophen (TYLENOL) tablet 650 mg  650 mg Oral Q6H PRN Juluis Mire, MD       Or  . acetaminophen (TYLENOL) suppository 650 mg  650 mg Rectal Q6H PRN Juluis Mire, MD      . feeding supplement (VITAL HIGH PROTEIN) liquid 1,000 mL  1,000 mL Per Tube Q24H Vishal Mungal, MD   1,000 mL at 09/04/2014 1802  . fentaNYL (SUBLIMAZE) injection 100 mcg  100 mcg Intravenous Q2H PRN Vilinda Boehringer, MD   100 mcg at 09/16/14 0849  . free water 25 mL  25 mL Per Tube 6 times per day Vilinda Boehringer, MD   25 mL at 09/16/14 0420  . gabapentin (NEURONTIN) capsule 300 mg  300 mg Oral TID Juluis Mire, MD   300 mg at 08/29/2014 2148  . ipratropium-albuterol (DUONEB) 0.5-2.5 (3) MG/3ML nebulizer solution 3 mL  3 mL Nebulization Q6H Vishal Mungal, MD   3 mL at 09/16/14 0748  . levofloxacin (LEVAQUIN) IVPB 750 mg  750 mg Intravenous Q24H Epifanio Lesches, MD      . norepinephrine (LEVOPHED) 4mg  in D5W 235mL premix infusion  2-50 mcg/min Intravenous Continuous Epifanio Lesches, MD 18.8 mL/hr at 09/16/14 0814 5 mcg/min at 09/16/14 0814  . ondansetron (ZOFRAN) tablet 4 mg  4 mg Oral Q6H PRN Juluis Mire, MD       Or  . ondansetron [facility]) injection 4 mg  4 mg Intravenous Q6H PRN Juluis Mire, MD      . oxyCODONE (Oxy IR/ROXICODONE) immediate release tablet 15 mg  15 mg Oral Q4H PRN Juluis Mire, MD      . pantoprazole (PROTONIX) EC tablet 40 mg  40 mg Oral Daily Juluis Mire, MD   40 mg at 09/24/2014 1337  . propofol (DIPRIVAN) 1000 MG/100ML infusion  5-70 mcg/kg/min Intravenous Titrated Vishal Mungal, MD 6 mL/hr at 09/09/2014 2343 12 mcg/kg/min at 09/07/2014 2343  . senna (SENOKOT) tablet 8.6 mg  1 tablet  Oral BID PRN Epifanio Lesches, MD      . sertraline (ZOLOFT) tablet 200 mg  200 mg Oral Daily Juluis Mire, MD   200 mg at 08/29/2014 1339  . sodium chloride 0.9 % injection 3 mL  3 mL Intravenous Q12H Juluis Mire, MD   3 mL at 08/25/2014 2151  . vancomycin (VANCOCIN) IVPB 1000 mg/200 mL premix  1,000 mg Intravenous Q12H Epifanio Lesches, MD   1,000 mg at  2341      Allergies: Allergies  Allergen Reactions  . Penicillins Hives      Past Medical History: Past Medical History  Diagnosis Date  . DVT (deep venous thrombosis)   . Pancreatic cancer      Past Surgical History: Past Surgical History  Procedure Laterality Date  . Hernia repair       Family History: History reviewed. No pertinent family history.   Social History: Social History   Social History  . Marital Status: Single    Spouse Name: N/A  . Number of Children: N/A  . Years of Education: N/A   Occupational History  . Not on file.   Social History Main Topics  . Smoking status: Former Research scientist (life sciences)  . Smokeless tobacco: Never Used  . Alcohol Use: Not on file  . Drug Use: No  . Sexual Activity: Not on file   Other Topics Concern  . Not on file   Social History Narrative  . No narrative on file     Review of Systems: Unobtainable because patient is intubated and sedated   Vital Signs: Blood pressure 76/66, pulse 129, temperature 100.6 F (38.1 C), temperature source Oral, resp. rate 36, height 5\' 10"  (1.778 m), weight 84.7 kg (186 lb 11.7 oz), SpO2 94 %.   Intake/Output Summary (Last 24 hours) at 09/16/14 0930 Last data filed at 09/16/14 0500  Gross per 24 hour  Intake 515.99 ml  Output    430 ml  Net  85.99 ml    Weight trends: [employer]   08/29/2014 0251 09/01/2014 1500  Weight: 83.689 kg (184 lb 8 oz) 84.7 kg (186 lb 11.7 oz)    Physical Exam: General:  critically ill-appearing,   HEENT  temporal wasting, ET tube in place   Neck:  supple, no masses   Lungs:   ventilator assisted, clear anteriorly and laterally   Heart::  regular rhythm, no rub or gallop   Abdomen:  soft, mass palpated in the right upper quadrant and mid abdomen   Extremities:  no significant peripheral edema   Neurologic:  not following commands, sedated   Skin:  normal turgor,   Access:  to be placed   Foley:  present        Lab results: Basic Metabolic Panel:  Recent Labs Lab 09/22/2014 0249 09/19/2014 1343 09/16/14 0716  NA 133* 134* 135  K 4.9 5.2* 6.2*  CL 101 103 105  CO2 18* 20* 17*  GLUCOSE 118* 144* 129*  BUN 57* 60* 75*  CREATININE 1.68* 1.61* 2.57*  CALCIUM 7.8* 7.8* 8.0*    Liver Function Tests:  Recent Labs Lab 09/16/14 0716  AST 782*  ALT 206*  ALKPHOS 468*  BILITOT 4.2*  PROT 6.2*  ALBUMIN 2.1*   No results for input(s): LIPASE, AMYLASE in the last 168 hours.  Recent Labs Lab 09/16/14 0805  AMMONIA 69*    CBC:  Recent Labs Lab 08/31/2014 0249 09/22/2014 1808 09/16/14 0716  WBC 43.3*  --  41.1*  HGB 10.0* 9.9* 10.4*  HCT 30.9* 31.6* 32.3*  MCV 88.9  --  90.1  PLT 365  --  285    Cardiac Enzymes: No results for input(s): CKTOTAL, TROPONINI in the last 168 hours.  BNP: Invalid input(s): POCBNP  CBG:  Recent Labs Lab 09/22/2014 1623 09/13/2014 2136 09/09/2014 2352 09/16/14 0358 09/16/14 0718  GLUCAP 165* 116* 126* 131* 125*    Microbiology: Recent Results (from the past 720 hour(s))  Blood culture (routine x 2)     Status: None (Preliminary result)   Collection Time: 09/21/2014  4:08 AM  Result Value Ref Range Status   Specimen Description BLOOD LEFT ARM  Final   Special Requests BOTTLES DRAWN AEROBIC AND ANAEROBIC 5CC  Final   Culture NO GROWTH 1 DAY  Final   Report Status PENDING  Incomplete  Blood culture (routine x 2)     Status: None (Preliminary result)   Collection Time: 09/16/2014  4:08 AM  Result Value Ref Range Status   Specimen Description BLOOD RIGHT  Final   Special Requests BOTTLES DRAWN AEROBIC AND  ANAEROBIC 5CC  Final   Culture NO GROWTH 1 DAY  Final   Report Status PENDING  Incomplete  MRSA PCR Screening     Status: None   Collection Time: 08/27/2014 12:30 PM  Result Value Ref Range Status   MRSA by PCR NEGATIVE NEGATIVE Final    Comment:        The GeneXpert MRSA Assay (FDA approved for NASAL specimens only), is one component of a comprehensive MRSA colonization surveillance program. It is not intended to diagnose MRSA infection nor to guide or monitor treatment for MRSA infections.      Coagulation Studies:  Recent Labs  08/26/2014 1240  LABPROT 19.5*  INR 1.63    Urinalysis: No results for input(s): COLORURINE, LABSPEC, PHURINE, GLUCOSEU, HGBUR, BILIRUBINUR, KETONESUR, PROTEINUR, UROBILINOGEN, NITRITE, LEUKOCYTESUR in the last 72 hours.  Invalid input(s): APPERANCEUR    Imaging: Dg Abd 1 View  09/09/2014   CLINICAL DATA:  Enteric catheter placement.  Status post intubation.  EXAM: ABDOMEN - 1 VIEW  COMPARISON:  None.  FINDINGS: Limited view of the abdomen demonstrates enteric catheter overlying the expected location of gastric body, with side port 5 cm inferior to the expected location of gastro esophageal junction. Bowel gas pattern, where visualized, is nonobstructive. Again seen is complete opacification of the right hemithorax.  IMPRESSION: Enteric catheter placement, side port slightly inferior to the expected location of gastrointestinal junction.   Electronically Signed  By: Fidela Salisbury M.D.   On: 08/29/2014 13:12   Dg Chest Port 1 View  09/13/2014   CLINICAL DATA:  Right pleural effusion, status post right thoracentesis.  EXAM: PORTABLE CHEST - 1 VIEW  COMPARISON:  08/30/2014.  FINDINGS: A right pleural effusion has decreased in size. There appears to be a small to moderate right effusion present.  Right lower lung atelectasis is identified.  The cardiomediastinal silhouette is unchanged.  An endotracheal tube with tip 3.2 cm above the carina, NG tube  entering the stomach with tip off the field of view and right IJ Port-A-Cath with tip overlying the superior cavoatrial junction again noted. There is no evidence of pneumothorax.  IMPRESSION: Decreased right pleural effusion status post right thoracentesis. No evidence of pneumothorax.  Right lower lung atelectasis and support apparatus as described.   Electronically Signed   By: Margarette Canada M.D.   On: 08/26/2014 15:47   Dg Chest Port 1 View  09/22/2014   CLINICAL DATA:  Right posttherapy failure. Status post intubation and line placement.  EXAM: PORTABLE CHEST - 1 VIEW  COMPARISON:  09/02/2014  FINDINGS: There has been interval intubation with the endotracheal tube overlying the tracheal air column and terminating 3.3 cm superior to the carina. Right-sided central venous catheter has been placed, tip overlying the expected location of cavoatrial junction. Enteric catheter transverses the thorax, tip collimated off the image.  Cardiomediastinal silhouette and mediastinal contours are partially obscured by complete opacification of the right hemithorax.  The left lung is clear.  There is no evidence of pneumothorax.  Osseous structures are without acute abnormality. Soft tissues are grossly normal.  IMPRESSION: Status post intubation, endotracheal tube 3.3 cm superior to the carina.  New complete opacification of the right hemithorax, slightly worse than on the prior radiograph.   Electronically Signed   By: Fidela Salisbury M.D.   On: 08/28/2014 13:09   Dg Chest Port 1 View  08/25/2014   CLINICAL DATA:  Shortness of breath. Recent treatment for infection. Decreased oxygen saturation.  EXAM: PORTABLE CHEST - 1 VIEW  COMPARISON:  None.  FINDINGS: Near complete white out of the right hemi thorax with aeration only of the right apex. This may indicate a large pleural effusion and/or consolidation or atelectasis on the right. Left lung is clear. Heart size as visualized appears normal. No pneumothorax.  Right-sided Infuse-A-Port with tip over the low SVC region.  IMPRESSION: Near complete white out of the right hemi thorax consistent with large pleural effusion and/or consolidation or atelectasis.   Electronically Signed   By: Lucienne Capers M.D.   On: 09/16/2014 03:18   US Thoracentesis Asp Pleural Space W/img Guide     INDICATION: History of stage IV pancreatic cancer, now with large symptomatic right-sided pleural effusion. Please perform ultrasound-guided right-sided thoracentesis for diagnostic and therapeutic purposes.  EXAM: US THORACENTESIS ASP PLEURAL SPACE W/IMG GUIDE  COMPARISON:  Chest radiograph -09/11/2014  MEDICATIONS: None  COMPLICATIONS: None immediate  TECHNIQUE: Informed written consent was obtained from the patient's family after a discussion of the risks, benefits and alternatives to treatment. A timeout was performed prior to the initiation of the procedure.  Initial ultrasound scanning demonstrates a large right-sided pleural effusion which is noted to contain multiple internal low level echoes. The antero lateral aspect of the right lower chest was prepped and draped in the usual sterile fashion. 1% lidocaine was used for local anesthesia. An ultrasound image was saved for documentation purposes. An 8 Fr  Safe-T-Centesis catheter was introduced. The thoracentesis was performed. The catheter was removed and a dressing was applied. The patient tolerated the procedure well without immediate post procedural complication.  FINDINGS: A total of approximately 1.8 liters of blood tinged serous fluid was removed. Requested samples were sent to the laboratory.  As this was the patient's first thoracentesis, the thoracentesis was stopped following the acquisition of 1.8 L of fluid though additional fluid remains.  IMPRESSION: Successful ultrasound-guided right sided thoracentesis yielding 1.8 liters of blood tinged serous pleural fluid.   Electronically Signed   By: Sandi Mariscal M.D.   On:  09/01/2014 16:05      Assessment & Plan: Pt is a 54 y.o. yo male with a PMHX of pancreatic cancer, undergoing chemotherapy at [facility], DVT, was admitted on  with shortness of breath and hypoxia.   1. ARF. Oliguric. Likely secondary to ATN 2. Hyperkalemia 3. Acute respiratory failure 4. Pancreatic cancer  Plan: Patient's renal failure is not expected to recover any time soon due to his underlying illness. Due to oliguria, potassium is expected to remain high and difficult to treat. Therefore, recommend renal replacement therapy. Patient is currently on pressors therefore suggest starting CRRT. I have talked with critical care team. They will place a Vas-Cath.

## 2014-09-17 ENCOUNTER — Inpatient Hospital Stay: Payer: Medicare Other

## 2014-09-17 DIAGNOSIS — J189 Pneumonia, unspecified organism: Secondary | ICD-10-CM

## 2014-09-17 DIAGNOSIS — Z515 Encounter for palliative care: Secondary | ICD-10-CM

## 2014-09-17 DIAGNOSIS — R001 Bradycardia, unspecified: Secondary | ICD-10-CM

## 2014-09-17 DIAGNOSIS — J9601 Acute respiratory failure with hypoxia: Secondary | ICD-10-CM

## 2014-09-17 LAB — BODY FLUID CELL COUNT WITH DIFFERENTIAL
EOS FL: 0 %
Lymphs, Fluid: 8 %
Monocyte-Macrophage-Serous Fluid: 5 %
NEUTROPHIL FLUID: 87 %
OTHER CELLS FL: 0 %
Total Nucleated Cell Count, Fluid: 2749 cu mm

## 2014-09-17 LAB — BASIC METABOLIC PANEL
Anion gap: 20 — ABNORMAL HIGH (ref 5–15)
BUN: 59 mg/dL — AB (ref 6–20)
CALCIUM: 7.2 mg/dL — AB (ref 8.9–10.3)
CHLORIDE: 100 mmol/L — AB (ref 101–111)
CO2: 13 mmol/L — AB (ref 22–32)
CREATININE: 2.95 mg/dL — AB (ref 0.61–1.24)
GFR calc Af Amer: 26 mL/min — ABNORMAL LOW (ref >60)
GFR calc non Af Amer: 23 mL/min — ABNORMAL LOW (ref >60)
GLUCOSE: 101 mg/dL — AB (ref 65–99)
Potassium: 6.9 mmol/L (ref 3.5–5.1)
Sodium: 133 mmol/L — ABNORMAL LOW (ref 135–145)

## 2014-09-17 LAB — PROTIME-INR
INR: 3.11
Prothrombin Time: 32.1 seconds — ABNORMAL HIGH (ref 11.4–15.0)

## 2014-09-17 LAB — TYPE AND SCREEN
ABO/RH(D): O POS
ANTIBODY SCREEN: NEGATIVE
UNIT DIVISION: 0
UNIT DIVISION: 0

## 2014-09-17 LAB — MAGNESIUM
Magnesium: 2.8 mg/dL — ABNORMAL HIGH (ref 1.7–2.4)
Magnesium: 3.2 mg/dL — ABNORMAL HIGH (ref 1.7–2.4)

## 2014-09-17 LAB — MISC LABCORP TEST (SEND OUT)
LABCORP TEST CODE: 101170
LABCORP TEST NAME: 101170

## 2014-09-17 LAB — PROTEIN, BODY FLUID: Total protein, fluid: 3.9 g/dL

## 2014-09-17 LAB — GLUCOSE, CAPILLARY
GLUCOSE-CAPILLARY: 118 mg/dL — AB (ref 65–99)
Glucose-Capillary: 119 mg/dL — ABNORMAL HIGH (ref 65–99)

## 2014-09-17 LAB — GLUCOSE, SEROUS FLUID: Glucose, Fluid: 74 mg/dL

## 2014-09-17 LAB — LACTATE DEHYDROGENASE, PLEURAL OR PERITONEAL FLUID: LD FL: 4174 U/L — AB (ref 3–23)

## 2014-09-17 LAB — CBC
HCT: 29 % — ABNORMAL LOW (ref 40.0–52.0)
Hemoglobin: 9.3 g/dL — ABNORMAL LOW (ref 13.0–18.0)
MCH: 29.5 pg (ref 26.0–34.0)
MCHC: 32 g/dL (ref 32.0–36.0)
MCV: 92.2 fL (ref 80.0–100.0)
PLATELETS: 114 10*3/uL — AB (ref 150–440)
RBC: 3.15 MIL/uL — ABNORMAL LOW (ref 4.40–5.90)
RDW: 22.9 % — AB (ref 11.5–14.5)
WBC: 60.8 10*3/uL — AB (ref 3.8–10.6)

## 2014-09-17 LAB — CYTOLOGY - NON PAP

## 2014-09-17 LAB — ALBUMIN: ALBUMIN: 1.7 g/dL — AB (ref 3.5–5.0)

## 2014-09-17 LAB — PHOSPHORUS: Phosphorus: 11.8 mg/dL — ABNORMAL HIGH (ref 2.5–4.6)

## 2014-09-17 MED ORDER — VASOPRESSIN 20 UNIT/ML IV SOLN
0.0300 [IU]/min | INTRAVENOUS | Status: DC
  Administered 2014-09-17: 0.03 [IU]/min via INTRAVENOUS
  Filled 2014-09-17: qty 2

## 2014-09-17 MED ORDER — DEXMEDETOMIDINE HCL IN NACL 400 MCG/100ML IV SOLN
0.4000 ug/kg/h | INTRAVENOUS | Status: DC
  Administered 2014-09-17: 0.4 ug/kg/h via INTRAVENOUS
  Filled 2014-09-17: qty 50
  Filled 2014-09-17 (×2): qty 100

## 2014-09-17 MED ORDER — HYDROCORTISONE SOD SUCCINATE 100 MG PF FOR IT USE: 50.0000 mg | Freq: Four times a day (QID) | INTRAMUSCULAR | Status: DC

## 2014-09-17 MED ORDER — PHENYLEPHRINE HCL 10 MG/ML IJ SOLN
0.0000 ug/min | INTRAMUSCULAR | Status: DC
  Filled 2014-09-17: qty 1

## 2014-09-17 MED ORDER — ANTISEPTIC ORAL RINSE SOLUTION (CORINZ)
7.0000 mL | Freq: Four times a day (QID) | OROMUCOSAL | Status: DC
  Filled 2014-09-17 (×4): qty 7

## 2014-09-17 MED ORDER — VECURONIUM BROMIDE 10 MG IV SOLR: 10.0000 mg | INTRAVENOUS | Status: DC | PRN

## 2014-09-17 MED ORDER — CHLORHEXIDINE GLUCONATE 0.12% ORAL RINSE (MEDLINE KIT)
15.0000 mL | Freq: Two times a day (BID) | OROMUCOSAL | Status: DC
  Administered 2014-09-17: 15 mL via OROMUCOSAL
  Filled 2014-09-17 (×3): qty 15

## 2014-09-17 MED ORDER — HYDROCORTISONE NA SUCCINATE PF 100 MG IJ SOLR
50.0000 mg | Freq: Four times a day (QID) | INTRAMUSCULAR | Status: DC
  Administered 2014-09-17: 50 mg via INTRAVENOUS
  Filled 2014-09-17: qty 2

## 2014-09-17 MED ORDER — SODIUM CHLORIDE 0.9 % IV BOLUS (SEPSIS)
500.0000 mL | Freq: Once | INTRAVENOUS | Status: AC
  Administered 2014-09-17: 500 mL via INTRAVENOUS

## 2014-09-17 MED ORDER — SODIUM CHLORIDE 0.9 % IV SOLN: 500.0000 mg | Freq: Every day | INTRAVENOUS | Status: DC

## 2014-09-17 MED FILL — Medication: Qty: 1 | Status: AC

## 2014-09-17 NOTE — Progress Notes (Signed)
 PULMONARY / CRITICAL CARE MEDICINE   Name: Marisa Hufstetler MRN: [id number] DOB: 05-Nov-1960    ADMISSION DATE:  09/02/2014 CONSULTATION DATE:  09/21/2014  REFERRING MD :  Dr. Reece Levy   CHIEF COMPLAINT:     Short of breath and confusion     SUBJECTIVE: Patient intubated and sedated on the vent. Worsening hypotension with acute renal failure, requiring CVL, Arterial line, vas cath placement and pressors yesterday.  Overnight CRRT switched to intermittent HD due to increase sludding from inc WBC.  Continues to require pressors S\P thoracentesis, concern for hemothorax and possible empyema, but h/h stable overnight. [name] ([place]) informed and updated on status of patient.  Significant Events 8/22>>intubated (respiratory distress, confusion, and AMS) 8/22>>R thoracentesis (U/S) (1.5L, bloody looking, but not clotting) 8/23>>CVL, Arterial line, Vas Cath placement, CRRT 8/23>> repeat R Thoracentesis (U/S) (2.3L, bloody looking, but not clotting) 8/24>> start stress dose steroids.  HISTORY OF PRESENT ILLNESS    54 year old male past medical history of stage IV pancreatic cancer, history of DVT, recent admission at [facility] 10 days ago for a suspected "infection", sign out AMA, admitted for worsening shortness of breath with hypoxia and worsening confusion over the past 2-3 days. Patient is noted to be altered and confused on BiPAP at the time of evaluation. History obtained from next of kin who is an aunt, who stated that patient has a history of stage IV pancreatic cancer, sign out AMA from [facility] as stated above against the family wishes. He's been having worsening confusion over the last 2-3 days along with labored breathing, and which point EMS was called as stated above. Per arrival to the ICU is noted to be on BiPAP, with severe agitation, received 2 mg of Ativan, then became somnolent, and is becoming more awake however with moderate to severe confusion and altered mental status, his  ABG showed mild metabolic acidosis. Chest x-ray with significant pleural effusion and white count, upon my evaluation patient's noted to be moderately overweight but severely confused, slurred speech, increase use of accessory muscles. I have spoken to his aunt, [name], who stated that he is a full code.       PAST MEDICAL HISTORY    :  Past Medical History  Diagnosis Date  . DVT (deep venous thrombosis)   . Pancreatic cancer    Past Surgical History  Procedure Laterality Date  . Hernia repair     Prior to Admission medications   Medication Sig Start Date End Date Taking? Authorizing Provider  ciprofloxacin (CIPRO) 500 MG tablet Take 1 tablet by mouth 2 (two) times daily. 09/01/14  Yes Historical Provider, MD  gabapentin (NEURONTIN) 300 MG capsule Take 1 capsule by mouth 3 (three) times daily. 08/13/14  Yes Historical Provider, MD  LORazepam (ATIVAN) 2 MG tablet Take 1 tablet by mouth 3 (three) times daily. 08/18/14  Yes Historical Provider, MD  oxyCODONE (ROXICODONE) 15 MG immediate release tablet Take 1 tablet by mouth every 4 (four) hours as needed. 08/29/14  Yes Historical Provider, MD  sertraline (ZOLOFT) 100 MG tablet Take 2 tablets by mouth daily. 08/18/14  Yes Historical Provider, MD   Allergies  Allergen Reactions  . Penicillins Hives     FAMILY HISTORY   History reviewed. No pertinent family history.    SOCIAL HISTORY    reports that he has quit smoking. He has never used smokeless tobacco. He reports that he does not use illicit drugs. His alcohol history is not on file.  Review  of Systems  Eyes: Positive for photophobia.   ROS 8/24 - unable to obtain, secondary to intubation/sedation    VITAL SIGNS    Temp:  [97.4 F (36.3 C)-98 F (36.7 C)] 97.8 F (36.6 C) (08/24 0730) Pulse Rate:  [100-121] 115 (08/24 0730) Resp:  [0-25] 23 (08/24 0740) BP: (70-154)/(45-94) 70/51 mmHg (08/24 0740) SpO2:  [90 %-100 %] 95 % (08/24 0730) Arterial Line BP:  (80-162)/(33-72) 85/39 mmHg (08/24 0740) FiO2 (%):  [50 %-100 %] 50 % (08/24 0801) Weight:  [177 lb 4 oz (80.4 kg)-182 lb 5.1 oz (82.7 kg)] 177 lb 4 oz (80.4 kg) (08/24 0400) HEMODYNAMICS:   VENTILATOR SETTINGS: Vent Mode:  [-] PRVC FiO2 (%):  [50 %-100 %] 50 % Set Rate:  [20 bmp] 20 bmp Vt Set:  [450 mL] 450 mL PEEP:  [5 cmH20] 5 cmH20 Plateau Pressure:  [13 cmH20] 13 cmH20 INTAKE / OUTPUT:  Intake/Output Summary (Last 24 hours) at  0912 Last data filed at  0800  Gross per 24 hour  Intake 1296.02 ml  Output    120 ml  Net 1176.02 ml       PHYSICAL EXAM   Physical Exam HENT: Head: Normocephalic and atraumatic. External ear normal.  Nose: Nose normal. Eyes: pupils sluggish, on sedation Neck: Normal range of motion. Neck supple. No JVD present. No thyromegaly present.  Cardiovascular: Normal rate, regular rhythm, normal heart sounds and intact distal pulses. Exam reveals no friction rub.  No murmur heard. Respiratory: on MV, dec BS on the right, coarse upper airway sounds.  GI: Bowel sounds are normal.  Musculoskeletal: no lesions Lymphadenopathy:   He has no cervical adenopathy.  Neurological: unable to assess, on vent intubated and sedated    LABS   LABS:  CBC  Recent Labs Lab 09/16/14 1737 09/16/14 2134  0430  WBC 55.8* 52.8* 60.8*  HGB 9.2* 8.9* 9.3*  HCT 29.6* 28.6* 29.0*  PLT 129* 124* 114*   Coag's  Recent Labs Lab 08/29/2014 1240  APTT 33  INR 1.63   BMET  Recent Labs Lab 09/16/14 1113 09/16/14 1737 09/16/14 2134  NA 134*  135 132* 136  K 6.5*  6.5* 5.8* 5.7*  CL 105  104 103 106  CO2 17*  19* 15* 15*  BUN 77*  76* 65* 66*  CREATININE 2.90*  2.80* 2.53* 2.35*  GLUCOSE 153*  154* 291* 170*   Electrolytes  Recent Labs Lab 09/16/14 1113 09/16/14 1737 09/16/14 2134  0430  CALCIUM 7.6*  7.8* 7.0* 7.3*  --   MG 3.5* 3.0*  --  2.8*  PHOS 9.3*  --  7.5*  --    Sepsis  Markers  Recent Labs Lab 09/05/2014 0249  LATICACIDVEN 3.9*   ABG  Recent Labs Lab 09/03/2014 0247 09/24/2014 1330 09/16/14 0750  PHART 7.35 7.08* 7.35  PCO2ART 33 67* 26*  PO2ART 76* 78* 73*   Liver Enzymes  Recent Labs Lab 09/21/2014 1343 09/16/14 0716 09/16/14 1113 09/16/14 2134  AST 270* 782* 972*  --   ALT 105* 206* 230*  --   ALKPHOS 393* 468* 497*  --   BILITOT 3.4* 4.2* 4.1*  --   ALBUMIN 2.3* 2.1* 2.0*  2.0* 1.6*   Cardiac Enzymes  Recent Labs Lab 09/16/14 1309 09/16/14 1737  TROPONINI 0.09* 0.08*   Glucose  Recent Labs Lab 09/16/14 0358 09/16/14 0718 09/16/14 1224 09/16/14 1606  0609  0723  GLUCAP 131* 125* 151* 193* 119* 118*  Recent Results (from the past 240 hour(s))  Blood culture (routine x 2)     Status: None (Preliminary result)   Collection Time: 08/29/2014  4:08 AM  Result Value Ref Range Status   Specimen Description BLOOD LEFT ARM  Final   Special Requests BOTTLES DRAWN AEROBIC AND ANAEROBIC 5CC  Final   Culture NO GROWTH 2 DAYS  Final   Report Status PENDING  Incomplete  Blood culture (routine x 2)     Status: None (Preliminary result)   Collection Time: 08/26/2014  4:08 AM  Result Value Ref Range Status   Specimen Description BLOOD RIGHT  Final   Special Requests BOTTLES DRAWN AEROBIC AND ANAEROBIC 5CC  Final   Culture NO GROWTH 2 DAYS  Final   Report Status PENDING  Incomplete  MRSA PCR Screening     Status: None   Collection Time: 09/01/2014 12:30 PM  Result Value Ref Range Status   MRSA by PCR NEGATIVE NEGATIVE Final    Comment:        The GeneXpert MRSA Assay (FDA approved for NASAL specimens only), is one component of a comprehensive MRSA colonization surveillance program. It is not intended to diagnose MRSA infection nor to guide or monitor treatment for MRSA infections.   Body fluid culture     Status: None (Preliminary result)   Collection Time: 09/05/2014  2:21 PM  Result Value Ref Range Status    Specimen Description PLEURAL  Final   Special Requests NONE  Final   Gram Stain PENDING  Incomplete   Culture NO GROWTH 1 DAY  Final   Report Status PENDING  Incomplete     Current facility-administered medications:  .  0.9 %  sodium chloride infusion, 100 mL, Intravenous, PRN, Harmeet Singh, MD .  0.9 %  sodium chloride infusion, 100 mL, Intravenous, PRN, Murlean Iba, MD .  acetaminophen (TYLENOL) tablet 650 mg, 650 mg, Oral, Q6H PRN **OR** acetaminophen (TYLENOL) suppository 650 mg, 650 mg, Rectal, Q6H PRN, Juluis Mire, MD .  alteplase (CATHFLO ACTIVASE) injection 2 mg, 2 mg, Intracatheter, Once PRN, Murlean Iba, MD .  antiseptic oral rinse solution (CORINZ), 7 mL, Mouth Rinse, QID, Epifanio Lesches, MD .  chlorhexidine gluconate (PERIDEX) 0.12 % solution 15 mL, 15 mL, Mouth Rinse, BID, Epifanio Lesches, MD, 15 mL at  0828 .  dexmedetomidine (PRECEDEX) 400 MCG/100ML (4 mcg/mL) infusion, 0.4-1.2 mcg/kg/hr, Intravenous, Titrated, Jaydalyn Demattia, MD .  feeding supplement (VITAL HIGH PROTEIN) liquid 1,000 mL, 1,000 mL, Per Tube, Q24H, Cylie Dor, MD, 1,000 mL at 09/14/2014 1802 .  fentaNYL (SUBLIMAZE) injection 100 mcg, 100 mcg, Intravenous, Q2H PRN, Vilinda Boehringer, MD, 100 mcg at 09/16/14 1522 .  free water 25 mL, 25 mL, Per Tube, 6 times per day, Vilinda Boehringer, MD, 25 mL at  0828 .  gabapentin (NEURONTIN) capsule 300 mg, 300 mg, Oral, QHS, Harmeet Singh, MD, 300 mg at 09/16/14 2218 .  heparin injection 1,000 Units, 1,000 Units, Dialysis, PRN, Murlean Iba, MD, 2,600 Units at 09/16/14 2350 .  heparin injection 1,000-6,000 Units, 1,000-6,000 Units, CRRT, PRN, Murlean Iba, MD .  meropenem (MERREM) 1 g in sodium chloride 0.9 % 100 mL IVPB, 1 g, Intravenous, Q12H, Sue Mcalexander, MD, 1 g at 09/16/14 2218 .  norepinephrine (LEVOPHED) 16 mg in dextrose 5 % 250 mL (0.064 mg/mL) infusion, 0-40 mcg/min, Intravenous, Titrated, Epifanio Lesches, MD, Last Rate: 42.2  mL/hr at  0750, 45 mcg/min at  0750 .  ondansetron (ZOFRAN) tablet 4 mg, 4 mg,  Oral, Q6H PRN **OR** ondansetron (ZOFRAN) injection 4 mg, 4 mg, Intravenous, Q6H PRN, Juluis Mire, MD .  oxyCODONE (Oxy IR/ROXICODONE) immediate release tablet 15 mg, 15 mg, Oral, Q4H PRN, Juluis Mire, MD .  pantoprazole (PROTONIX) injection 40 mg, 40 mg, Intravenous, Q12H, Joee Iovine, MD, 40 mg at 09/16/14 2218 .  senna (SENOKOT) tablet 8.6 mg, 1 tablet, Oral, BID PRN, Epifanio Lesches, MD .  sertraline (ZOLOFT) tablet 200 mg, 200 mg, Oral, Daily, Juluis Mire, MD, 200 mg at  0902 .  sodium chloride 0.9 % injection 3 mL, 3 mL, Intravenous, Q12H, Juluis Mire, MD, 3 mL at 09/16/14 2219 .  vancomycin (VANCOCIN) IVPB 1000 mg/200 mL premix, 1,000 mg, Intravenous, Q24H, Harmeet Singh, MD, 1,000 mg at 09/16/14 2343 .  vasopressin (PITRESSIN) 40 Units in sodium chloride 0.9 % 250 mL (0.16 Units/mL) infusion, 0.03 Units/min, Intravenous, Continuous, Riana Tessmer, MD, Last Rate: 11.3 mL/hr at  0821, 0.03 Units/min at  4163  IMAGING    US Abdomen Complete  09/16/2014   CLINICAL DATA:  Elevated liver function test. Patient is a history pancreatic cancer with mid metastatic disease to the liver.  EXAM: ULTRASOUND ABDOMEN COMPLETE; EXAM IS VERY LIMITED AS THE PATIENT IS ON A VENTILATOR IN THE ICU. THE PATIENT BECAME UNSTABLE WHILE THE EXAM IS BEING PERFORMED. THE EXAM HAD TO BE STOPPED FOR THE NURSE TO CARE FOR HIM.  COMPARISON:  None.  FINDINGS: Gallbladder: Not evaluated.  Common bile duct: Diameter: Not evaluated.  Liver: There is marked heterogeneity of echotexture within the right lobe liver suspicious for diffuse metastasis. In the left lobe liver, there is a 1.5 x 1.1 x 1.5 cm hypoechoic mass. This is suspicious for metastasis.  IVC: No abnormality visualized.  Pancreas: Not well evaluated.  Spleen: Not evaluated.  Right Kidney: Length: Not evaluated.  Left Kidney:  Length: Not evaluated.  Abdominal aorta: No aneurysm visualized.  Other findings: None.  IMPRESSION: Limited exam due to the patient's instability. The findings are highly suspicious for diffuse metastatic disease in the right lobe liver. There is a mass in the left lobe liver suspicious for metastasis.   Electronically Signed   By: Abelardo Diesel M.D.   On: 09/16/2014 17:10   Dg Chest Port 1 View     CLINICAL DATA:  Acute respiratory failure.  EXAM: PORTABLE CHEST - 1 VIEW  COMPARISON:  September 16, 2014.  FINDINGS: Stable cardiomediastinal silhouette. Endotracheal tube is in grossly good position with distal tip 2.8 cm above the carina. Nasogastric tube is seen entering the stomach. Right internal jugular catheter line is noted with distal tip in expected position of right atrium. Right internal jugular dialysis catheter is noted with distal tip in expected position of cavoatrial junction. Mild central pulmonary vascular congestion is noted. No pneumothorax is noted. Right-sided pleural effusion is significantly smaller status post thoracentesis.  IMPRESSION: Right-sided pleural effusion is significantly smaller status post thoracentesis. Endotracheal and nasogastric tubes in grossly good position. Right-sided dialysis catheter is in grossly good position. Right internal jugular catheter is noted with distal tip in right atrium; withdrawal by 3-4 cm is recommended. These results will be called to the ordering clinician or representative by the Radiologist Assistant, and communication documented in the PACS or zVision Dashboard.   Electronically Signed   By: Marijo Conception, M.D.   On:  07:33   Dg Chest Port 1 View  09/16/2014   CLINICAL DATA:  Patient status post central line placement.  EXAM: PORTABLE CHEST - 1 VIEW  COMPARISON:  Chest radiograph 04/15/2014  FINDINGS: Interval insertion of right internal jugular central venous catheter which is visualized to the level of the right atrium,  given technique the tip is not clearly delineated. Persistent right anterior chest wall Port-A-Cath. Multiple monitoring leads overlie the patient. ET tube tip terminates in the distal trachea. Enteric tube courses inferior to the diaphragm. Multiple monitoring leads overlie the patient. Stable cardiomegaly. Persistent large right pleural effusion with underlying pulmonary consolidation in the right hemi thorax. The left lung is clear. No pneumothorax.  IMPRESSION: New right IJ central venous catheter is visualized to the level of the superior cavoatrial junction. The tip is not well delineated due to technique on current radiograph.  Cardiomegaly.  Persistent large right pleural effusion and underlying pulmonary consolidation.   Electronically Signed   By: Lovey Newcomer M.D.   On: 09/16/2014 14:37   US Thoracentesis Asp Pleural Space W/img Guide  09/16/2014   CLINICAL DATA:  Right pleural effusion.  EXAM: ULTRASOUND GUIDED right THORACENTESIS  COMPARISON:  September 15, 2014.  PROCEDURE: Informed consent was obtained from the patient's family. Ultrasound was performed to localize and mark an adequate pocket of fluid in the right chest. The area was then prepped and draped in the normal sterile fashion. 1% Lidocaine was used for local anesthesia. Under ultrasound guidance a Safe-T-Centesis catheter was introduced. Thoracentesis was performed. The catheter was removed and a dressing applied.  Complications:  None immediate.  FINDINGS: A total of approximately 2.3 L of bloody fluid was removed. A fluid sample wassent for laboratory analysis.  IMPRESSION: Successful ultrasound guided right thoracentesis yielding 2.3 L of pleural fluid.   Electronically Signed   By: Marijo Conception, M.D.   On: 09/16/2014 16:05      Indwelling Urinary Catheter continued, requirement due to   Reason to continue Indwelling Urinary Catheter for strict Intake/Output monitoring for hemodynamic instability   Central Line continued,  requirement due to   Reason to continue [employer] Monitoring of central venous pressure or other hemodynamic parameters   Ventilator continued, requirement due to, resp failure    Ventilator Sedation RASS 0 to -2      ASSESSMENT/PLAN  54 year old male past medical history of stage IV pancreatic cancer, followed at [facility], history of DVT on lovenox, now with respiratory failure, MSOF, severe sepsis, leukocytosis, ARF   PULMONARY  A: Acute hypoxic respiratory failure  Right-sided pleural effusion ??RLL PNA Metabolic acidosis P:   Secondary to right-sided pleural effusion, muscle fatigue - intubated 8/22 His pleural effusion had a bloody color, and LDH is 4276, concerning for empyema, CT surg following, but not intervention at this time.  Cont to monitor H/H MV, SBT daily, sedation vacation Patient with decompensation over the past 24 hours with renal failure, hypotension, now requiring intermittent HD and has multiorgan system failure (MSOF).  Met acidosis - 2/2 sepsis and MSOF,   CARDIOVASCULAR Hypotensive - requiring pressors Check ECHO Cont with hemodynamic support  Check CE MAP >65 (pressors levophed, vasopressin) Start stress dose steroids (solucortef 45m IV q6hrs)  RENAL A:Elevated creatinine - worsening  hyperkalemia P: Unknown baseline creatinine at this time - intermittent HD (due to sludging due crrt, inc wbc) Nephrology following Gentle IV fluids Electrolyte ICU protocol replacement  GASTROINTESTINAL Elevated LFTs - stop propofol, start precedex (if BP can tolerate) Elevated ammonia U/S abdomen - findings concerning for metastatic disease.  Mostly likely related to MSOF and shock (septic)  HEMATOLOGIC A: Stage IV pancreatic cancer Leukocytosis thrombocytopenia P: Monitor H&H and CBC H/H essentially stable from admission Severe sepsis with MSOF causing severe thrombocytopenia/leukocytosis - stop all forms of hep and any other forms  anticoagulation Hematology oncology following    INFECTIOUS A: sepsis - severe with MSOF P:   Source - ?? Right pleural effusion, ??empyema, ??HCAP Patient was been treated at [facility] 2 wks ago, by report per aunt, left AMA 8/22 right pleural fluid    Micro Bld Cx 8/22 >> NTD Sputum Cx 8/22 >>pending Trach Asp Cx 8/24>> Urine Cx 8/24 >> Pleural fluid Cx 8/22 >> Pleural fluid Cx 8/23 >> MRSA Screen>>negative  ABx Vanc 8/22>> Levaquin 8/22>>8/23 Meropenem 8/23>>  Lines: Right Femoral CVL 8/23>> Right Femoral arterial line 8/23>> Left Femoral vas cath 8/23>>8/23 (high pressures, not functioning) RIJ Vascath 8/23 (Vascular placed)>>  ENDOCRINE ICU Hypoglycemia/hyperglycemia protocol  NEUROLOGIC A: intubated and sedated P:   RASS goal: 0 to -1 while on MV   Social: FULL CODE - spoke with POA (Aunt, [name]), updated her on patient clinical status and management plan.  - palliative care consult to discuss goal of care - per heme/onc may need to transfer to [facility] to cont Pancreatic Ca treatment per there protocol, once patient is stablized.   I have personally obtained a history, examined the patient, evaluated laboratory and imaging results, formulated the assessment and plan and placed orders. CRITICAL CARE: The patient is critically ill with multiple organ systems failure and requires high complexity decision making for assessment and support, frequent evaluation and titration of therapies, application of advanced monitoring technologies and extensive interpretation of multiple databases. Critical Care Time devoted to patient care services described in this note is 45 minutes.    Vilinda Boehringer, MD [facility] Pager [phone] (Please enter 7-digits)      , 9:12 AM

## 2014-09-17 NOTE — Progress Notes (Addendum)
Dr. Jannifer Franklin notified of pt. Drop in pressure and HR 122 and need to increase levophed to 101mcg to sustain an arterial pressure of MAP >65. New orders for a 516ml bolus .

## 2014-09-17 NOTE — Progress Notes (Signed)
Palliative Care Update   This early am, I had reviewed the chart and familiarized myself with the dire condition that Mr. Steven Pineda was in related to his very terminal pancreatic cancer. I had been prepared to contact his nieces to discuss his full code status and possible withdrawal of care.  During morning rounds, the patient had drops in blood pressure and pulse and then went into PEA and a code blue was called.  The Code was managed by Dr. Stevenson Clinch, and while it was underway, I was able to call his aunt, [name], who has been making medical decisions while the patient has been unable to do so.  Once told that the patient had had a cardiac arrest and that resuscitation was underway, she asked me to stop it all immediately. This was done quickly and the patient was found to have no blood pressure or pulses and very soon after, asystole.    He is pronounced dead at 10:32 am.  Kirby Funk, MD

## 2014-09-17 NOTE — Progress Notes (Signed)
Code Blue called

## 2014-09-17 NOTE — Progress Notes (Signed)
Dr. Stevenson Clinch verbally notified that patient is decompensating quickly.  Hypotension continues on high does of levophed and vasopressin.  Verbal order obtained for neo-synephrine.

## 2014-09-17 NOTE — Progress Notes (Signed)
    1036  Clinical Encounter Type  Visited With Patient;Health care provider  Visit Type Spiritual support;Code;Critical Care;Death;Patient actively dying  Consult/Referral To Chaplain  Spiritual Encounters  Spiritual Needs Emotional;Grief support  Stress Factors  Patient Stress Factors Health changes  Chaplain was in the unit when the nurse noted that patient was coding. Supported the team and patient. Patient died. Offered a compassionate presence and support. Chaplain Ollivander See A. Hiroko Tregre Ext. [phone]

## 2014-09-17 NOTE — Consult Note (Signed)
 Palliative Medicine Inpatient Consult Note   Name: Steven Pineda Date:  MRN: [id number]  DOB: 10-May-1960  Referring Physician: Epifanio Lesches, MD  Palliative Care consult requested for this 54 y.o. male for goals of medical therapy in patient with terminal pancreatic cancer who is not intubated and sedated due to acute respiratory failure thought to be due pneumonia and right sided pleural effusions (malignant vs infectious).  He is also quite septic, having been hospitalzed for an 'infection' at [facility] and leaving AMA 10 days prior to admission on 09/03/2014.  He has been living with aunts locally, but his prior history is not known as He moved her 6 mos ago from [place] and had no primary care doctor or other family.  Aunt had informed his care team that he was to be full code, however.    TODAY'S EVENTS AND PLAN: Patient was full code and began to be bradycardic with low blood pressure.  Dr Sharlene Dory came immediately as did I as part of the MD rounding team that was nearby.  As his BP continued to drop, he went into PEA.  Dr Sharlene Dory managed the resuscitative effort as I went to call the pt's aunt, Steven Pineda.  As soon as I informed her that he had resuscitation underway, she asked that all of this be stopped immediately.  This was done and pt was noted to have no BP and a rhythm that quickly went to asystole.  Death occurred at 10:32 am.  I called his aunt one more time to inform her about the time of death. She and another relative are on their way here and I let them know I would be available to talk with them if needed.     REVIEW OF SYSTEMS:  Patient is not able to provide ROS due to being intubated and sedated    SOCIAL HISTORY:  reports that he has quit smoking. He has never used smokeless tobacco. He reports that he does not use illicit drugs.  LEGAL DOCUMENTS:  none  CODE STATUS: Full code  --changed to No Code Blue (with resuscitation discontinued after it had  started per request of his aunt who was called during the code.)  PAST MEDICAL HISTORY: Past Medical History  Diagnosis Date  . DVT (deep venous thrombosis)   . Pancreatic cancer     PAST SURGICAL HISTORY:  Past Surgical History  Procedure Laterality Date  . Hernia repair      ALLERGIES:  is allergic to penicillins.  MEDICATIONS:  Current Facility-Administered Medications  Medication Dose Route Frequency Provider Last Rate Last Dose  . 0.9 %  sodium chloride infusion  100 mL Intravenous PRN Harmeet Singh, MD      . 0.9 %  sodium chloride infusion  100 mL Intravenous PRN Murlean Iba, MD      . acetaminophen (TYLENOL) tablet 650 mg  650 mg Oral Q6H PRN Juluis Mire, MD       Or  . acetaminophen (TYLENOL) suppository 650 mg  650 mg Rectal Q6H PRN Juluis Mire, MD      . alteplase (CATHFLO ACTIVASE) injection 2 mg  2 mg Intracatheter Once PRN Murlean Iba, MD      . antiseptic oral rinse solution (CORINZ)  7 mL Mouth Rinse QID Epifanio Lesches, MD      . chlorhexidine gluconate (PERIDEX) 0.12 % solution 15 mL  15 mL Mouth Rinse BID Epifanio Lesches, MD   15 mL at  0828  . dexmedetomidine (PRECEDEX)  400 MCG/100ML (4 mcg/mL) infusion  0.4-1.2 mcg/kg/hr Intravenous Titrated Vishal Mungal, MD 8 mL/hr at  0935 0.4 mcg/kg/hr at  0935  . feeding supplement (VITAL HIGH PROTEIN) liquid 1,000 mL  1,000 mL Per Tube Q24H Vilinda Boehringer, MD   Stopped at  0936  . fentaNYL (SUBLIMAZE) injection 100 mcg  100 mcg Intravenous Q2H PRN Vilinda Boehringer, MD   100 mcg at 09/16/14 1522  . free water 25 mL  25 mL Per Tube 6 times per day Vilinda Boehringer, MD   25 mL at  0828  . gabapentin (NEURONTIN) capsule 300 mg  300 mg Oral QHS Harmeet Singh, MD   300 mg at 09/16/14 2218  . heparin injection 1,000 Units  1,000 Units Dialysis PRN Murlean Iba, MD   2,600 Units at 09/16/14 2350  . heparin injection 1,000-6,000 Units  1,000-6,000 Units CRRT PRN Murlean Iba,  MD      . hydrocortisone sodium succinate (SOLU-CORTEF) 100 MG injection 50 mg  50 mg Intravenous Q6H Vishal Mungal, MD   50 mg at  1018  . norepinephrine (LEVOPHED) 16 mg in dextrose 5 % 250 mL (0.064 mg/mL) infusion  0-40 mcg/min Intravenous Titrated Epifanio Lesches, MD 56.3 mL/hr at  1012 60 mcg/min at  1012  . ondansetron (ZOFRAN) tablet 4 mg  4 mg Oral Q6H PRN Juluis Mire, MD       Or  . ondansetron [facility]) injection 4 mg  4 mg Intravenous Q6H PRN Juluis Mire, MD      . oxyCODONE (Oxy IR/ROXICODONE) immediate release tablet 15 mg  15 mg Oral Q4H PRN Juluis Mire, MD      . pantoprazole (PROTONIX) injection 40 mg  40 mg Intravenous Q12H Vishal Mungal, MD   40 mg at  1018  . phenylephrine (NEO-SYNEPHRINE) 10 mg in dextrose 5 % 250 mL (0.04 mg/mL) infusion  0-400 mcg/min Intravenous Titrated Vishal Mungal, MD      . senna (SENOKOT) tablet 8.6 mg  1 tablet Oral BID PRN Epifanio Lesches, MD      . sertraline (ZOLOFT) tablet 200 mg  200 mg Oral Daily Juluis Mire, MD   200 mg at  0902  . sodium chloride 0.9 % injection 3 mL  3 mL Intravenous Q12H Juluis Mire, MD   3 mL at  1000  . vasopressin (PITRESSIN) 40 Units in sodium chloride 0.9 % 250 mL (0.16 Units/mL) infusion  0.03 Units/min Intravenous Continuous Vishal Mungal, MD 33.8 mL/hr at  1019 0.09 Units/min at  1019  . vecuronium (NORCURON) injection 10 mg  10 mg Intravenous Q1H PRN Vilinda Boehringer, MD        Vital Signs: BP 89/69 mmHg  Pulse 115  Temp(Src) 97.8 F (36.6 C) (Axillary)  Resp 22  Ht 5\' 10"  (1.778 m)  Wt 80.4 kg (177 lb 4 oz)  BMI 25.43 kg/m2  SpO2 95% Filed Weights    1500 09/16/14 1700  0400  Weight: 84.7 kg (186 lb 11.7 oz) 82.7 kg (182 lb 5.1 oz) 80.4 kg (177 lb 4 oz)    Estimated body mass index is 25.43 kg/(m^2) as calculated from the following:   Height as of this encounter: 5\' 10"  (1.778 m).   Weight as  of this encounter: 80.4 kg (177 lb 4 oz).  PERFORMANCE STATUS (ECOG) : 4 - Bedbound  PHYSICAL EXAM: Intubated Bradycardic with BP dropping Hrt rrr --pulses then not felt as he went into PEA Lungs vent sounds Skin  dusky  LABS: CBC:    Component Value Date/Time   WBC 60.8*  0430   HGB 9.3*  0430   HCT 29.0*  0430   PLT 114*  0430   MCV 92.2  0430   Comprehensive Metabolic Panel:    Component Value Date/Time   NA 133*  0956   K 6.9*  0956   CL 100*  0956   CO2 13*  0956   BUN 59*  0956   CREATININE 2.95*  0956   GLUCOSE 101*  0956   CALCIUM 7.2*  0956   AST 972* 09/16/2014 1113   ALT 230* 09/16/2014 1113   ALKPHOS 497* 09/16/2014 1113   BILITOT 4.1* 09/16/2014 1113   PROT 5.9* 09/16/2014 1113   ALBUMIN 1.7*  0844   TESTS  Abd [place] 09/16/14 Limited exam due to the patient's instability. The findings are highly suspicious for diffuse metastatic disease in the right lobe liver. There is a mass in the left lobe liver suspicious for Metastasis.  Echo 09/16/14: EF  IMPRESSION: 1.  Terminal Pancreatic Cancer  2.   Pneumonia 3.   Sepsis and septic shock due to pneumonia 4.   Right pleural effusion--fluid analysis concerning for empyema (culture pending) 5.   Acute respiratory failure due to pneumonia and effusion 6.   H/O DVT 7.  Depression     REFERRALS TO BE ORDERED:  none  More than 50% of the visit was spent in counseling/coordination of care: Yes  Time Spent: 50 minutes

## 2014-09-17 NOTE — Progress Notes (Signed)
Update CODE BLUE - 10:25am Total time - 92mins CODE Blue stopped due to family request.  Patient noted to have decreasing blood pressure, with increasing need for pressors (levophed, vasopressin, neo-syphrine), developed PEA arrest. ACLS protocol started, Dr. Megan Salon (palliative care), spoke with Steven Pineda (Aunt-POA) during the CODE, who stated to stop resuscitative/life saving/ACLS measures, with respect to the patient's wishes. CODE Blue stopped, time of death - 40  Vilinda Boehringer, MD [facility] Pager [phone] (please enter 7-digits) On Call Pager - [phone] (please enter 7-digits)

## 2014-09-17 NOTE — Progress Notes (Addendum)
    0959  Clinical Encounter Type  Visited With Patient;Health care provider  Visit Type Follow-up  Consult/Referral To Chaplain  Spiritual Encounters  Spiritual Needs Emotional  Stress Factors  Patient Stress Factors None identified  Chaplain rounded in unit and checked in on patient. Offered a compassionate presence and support as applicable. Family were not present but spoke and supported the nurses. Left consult order in system again so that other chaplains would be aware in my absence to support the family as applicable. Chaplain Lyn Joens A. Lysander Calixte Ext. [phone]

## 2014-09-17 NOTE — Progress Notes (Signed)
Dr. Stevenson Clinch notified of hypotension. Orders received for vasopressin.

## 2014-09-17 NOTE — Progress Notes (Addendum)
Pt obs to be PEA, pt full code, Code called@ 1025, meds given (see MAR), MD Stevenson Clinch and Megan Salon at [facility], MD Megan Salon notified family Shirlee Limerick of current Code, Shirlee Limerick pt's Aunt communicated with MD Megan Salon to stop all measures, Code stopped, pt deceased at 47 MD and nursing staff at [facility]

## 2014-09-17 NOTE — Clinical Social Work Note (Signed)
Consult received for nursing home placement. Patient is coding currently in ICU. Will defer consult until more appropriate time. Shela Leff MSW,LCSW [phone]

## 2014-09-17 NOTE — Progress Notes (Signed)
Dr. Stevenson Clinch spoke with Dr. Candiss Norse via phone and order obtained to re-start CRRT.

## 2014-09-17 NOTE — Progress Notes (Signed)
Dr. Stevenson Clinch notified verbally of continued hypotension and EKG changes.

## 2014-09-17 NOTE — Progress Notes (Signed)
Pt lying in bed completed hemodialysis. Pt did require an increase of levophed during treatment. Pt did spontaneously open eyes but did not track or make any purposeful movements. Pt cont. On propofol 21mcg/ and levophed at 27mcg

## 2014-09-17 NOTE — Progress Notes (Signed)
    1400  Clinical Encounter Type  Visited With Patient and family together  Visit Type Spiritual support  Referral From Nurse  Consult/Referral To Chaplain  Spiritual Encounters  Spiritual Needs Prayer  Stress Factors  Patient Stress Factors Health changes  Family Stress Factors Health changes   Status: nonverbal, extubated Acute respiratory failure with hypoxia Pancreatic cancer Family: 3 aunts, male cousin Faith: Catholic Visit Assessment: The chaplain conversed  with the family in a life review about Steven Pineda (the patient). The family was living in [place], the aunts about 4 years ago and the patient has relocated from [place] and [place] in the last 6 months. 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 The grieving family and the patient have a family history of cancer and heart disease. One of his aunt's that does not reside with him, shared that she hated cancer for what it has done to her family. The chaplain prayed with the family and the patient and they said that they no longer needed a priest to visit the patient and that the patient is now in heaven with his mother (their sister) and their other brothers.  Pastoral Care: [phone] pager or by online request.

## 2014-09-17 NOTE — Progress Notes (Signed)
Pt continues to occasionally w/d from  Painful stimuli and facial grimacing at times no purposeful movements. Cont. On propofol  /levophed. Tolerating TF well. UOP 165ml

## 2014-09-17 NOTE — Progress Notes (Signed)
Dr. Stevenson Clinch notified of continued hypotension with high doses of levophed and vasopressin infusing. Dr. Stevenson Clinch also notified of abnormal ABG results.  Per Dr. Stevenson Clinch page Dr. Candiss Norse to discuss re-starting CRRT.

## 2014-09-18 LAB — BLOOD GAS, ARTERIAL
ALLENS TEST (PASS/FAIL): POSITIVE — AB
Acid-base deficit: 18.9 mmol/L — ABNORMAL HIGH (ref 0.0–2.0)
Bicarbonate: 9.3 mEq/L — ABNORMAL LOW (ref 21.0–28.0)
FIO2: 0.5
O2 Saturation: 93.1 %
PEEP: 5 cmH2O
Patient temperature: 37
RATE: 20 resp/min
VT: 450 mL
pCO2 arterial: 30 mmHg — ABNORMAL LOW (ref 32.0–48.0)
pH, Arterial: 7.1 — CL (ref 7.350–7.450)
pO2, Arterial: 91 mmHg (ref 83.0–108.0)

## 2014-09-18 LAB — MISC LABCORP TEST (SEND OUT)
LabCorp test name: 101170
Labcorp test code: 101170

## 2014-09-18 LAB — HEPATITIS B SURFACE ANTIBODY,QUALITATIVE: HEP B S AB: NONREACTIVE

## 2014-09-18 LAB — CYTOLOGY - NON PAP

## 2014-09-18 LAB — HEPATITIS B SURFACE ANTIGEN: HEP B S AG: NEGATIVE

## 2014-09-19 LAB — BODY FLUID CULTURE: Culture: NO GROWTH

## 2014-09-20 LAB — CULTURE, BLOOD (ROUTINE X 2)
CULTURE: NO GROWTH
Culture: NO GROWTH

## 2014-09-21 LAB — BODY FLUID CULTURE: CULTURE: NO GROWTH

## 2014-09-21 LAB — CULTURE, RESPIRATORY W GRAM STAIN

## 2014-09-21 LAB — CULTURE, RESPIRATORY

## 2014-09-22 NOTE — Discharge Summary (Signed)
 Steven Pineda, is a 54 y.o. male  DOB Jun 28, 1960  MRN [id number].  Admission date:  08/27/2014  Admitting Physician  Juluis Mire, MD  Discharge Date:     Primary MD  No primary care provider on file.  Recommendations for primary care physician for things to follow:  Agent expired at 10:32 AM on August 24.   Admission Diagnosis  Dyspnea [R06.00] SOB (shortness of breath) [R06.02] Pleural effusion [J90] Hypoxia [R09.02] Healthcare-associated pneumonia [J18.9]   Discharge Diagnosis  Dyspnea [R06.00] SOB (shortness of breath) [R06.02] Pleural effusion [J90] Hypoxia [R09.02] Healthcare-associated pneumonia [J18.9]    Principal Problem:   Acute respiratory failure with hypoxia Active Problems:   Pleural effusion, right   Sepsis due to pneumonia   Pancreatic cancer   Dyspnea   Hypoxia   Pleural effusion   Elevated LFTs   Acute renal failure syndrome   Healthcare-associated pneumonia      Past Medical History  Diagnosis Date  . DVT (deep venous thrombosis)   . Pancreatic cancer     Past Surgical History  Procedure Laterality Date  . Hernia repair         History of present illness and  Hospital Course:     Kindly see H&P for history of present illness and admission details, please review complete Labs, Consult reports and Test reports for all details in brief  HPI  from the history and physical done on the day of admission  Hospital Course  54 year old male with history of stage IV pancreatic cancer admitted because of shortness of breath with severe respiratory distress on arrival to ED initially placed on BiPAP and admitted to ICU stepdown. t. Patient to have x-ray of the chest showed large right pleural effusion and consolidation and whiteout of right hemithorax. Patient continued  on BiPAP for  Few hours respiratory status got worse and had to be intubated. He was at [facility] 10 days ago and and he signed out [place]. Patient was seen by Dr. Nestor Lewandowsky for right pleural effusion, Dr. Grayland Ormond from oncology, Dr.Mungal for the pulmonary and critical care. Ultrasound-guided thoracocentesis 22nd by . IR drained 1.8 L of blood-tinged fluid. Also had thoracocentesis and 23rd and 2.3 L of 4 poor fluid was removed and sent for cultures. Patient acute respiratory failure thought to be right-sided pleural effusion also right-sided pneumonia. Patient decompensated and developed renal failure and septic shock and multiorgan failure. Had to go on pressors for BP support. Patient's  Aunt  [name] has been updated, palliative care consult also is obtained. On 23rd morning patient began to be bradycardic with low blood period. And bp continued to drop and he went into asystole. Palliative care physician also spoke with the family and the after family discussion patient was made DO NOT RESUSCITATE and CODE BLUE was stopped,Patient pronounced at that 10:32 AM on 24th August            Recent Results (from the past 240 hour(s))  Blood culture (routine x 2)     Status: None   Collection Time: 09/12/2014  4:08 AM  Result Value Ref Range Status   Specimen Description BLOOD LEFT ARM  Final   Special Requests BOTTLES DRAWN AEROBIC AND ANAEROBIC 5CC  Final   Culture NO GROWTH 5 DAYS  Final   Report Status 09/20/2014 FINAL  Final  Blood culture (routine x 2)     Status: None   Collection Time: 09/22/2014  4:08 AM  Result Value Ref Range  Status   Specimen Description BLOOD RIGHT  Final   Special Requests BOTTLES DRAWN AEROBIC AND ANAEROBIC 5CC  Final   Culture NO GROWTH 5 DAYS  Final   Report Status 09/20/2014 FINAL  Final  MRSA PCR Screening     Status: None   Collection Time: 08/26/2014 12:30 PM  Result Value Ref Range Status   MRSA by PCR NEGATIVE NEGATIVE Final    Comment:        The  GeneXpert MRSA Assay (FDA approved for NASAL specimens only), is one component of a comprehensive MRSA colonization surveillance program. It is not intended to diagnose MRSA infection nor to guide or monitor treatment for MRSA infections.   Body fluid culture     Status: None   Collection Time: 08/28/2014  2:21 PM  Result Value Ref Range Status   Specimen Description PLEURAL  Final   Special Requests NONE  Final   Gram Stain MODERATE WBC SEEN NO ORGANISMS SEEN   Final   Culture NO GROWTH 4 DAYS  Final   Report Status 09/19/2014 FINAL  Final  Body fluid culture     Status: None   Collection Time: 09/16/14  3:35 PM  Result Value Ref Range Status   Specimen Description PLEURAL  Final   Special Requests NONE  Final   Gram Stain   Final    GROSSLY BLOODY MODERATE WBC SEEN NO ORGANISMS SEEN    Culture NO GROWTH 4 DAYS  Final   Report Status 09/21/2014 FINAL  Final  Culture, respiratory (NON-Expectorated)     Status: None   Collection Time:   9:30 AM  Result Value Ref Range Status   Specimen Description TRACHEAL ASPIRATE  Final   Special Requests Immunocompromised  Final   Gram Stain   Final    GOOD SPECIMEN - 80-90% WBCS MODERATE WBC SEEN MODERATE YEAST MODERATE GRAM POSITIVE RODS MANY GRAM POSITIVE COCCI IN PAIRS    Culture HEAVY GROWTH CANDIDA ALBICANS  Final   Report Status 09/20/2014 FINAL  Final       Today   Subjective:  expired  Objective:     CBC w Diff:  Lab Results  Component Value Date   WBC 60.8*    HGB 9.3*    HCT 29.0*    PLT 114*     CMP:  Lab Results  Component Value Date   NA 133*    K 6.9*    CL 100*    CO2 13*    BUN 59*    CREATININE 2.95*    PROT 5.9* 09/16/2014   ALBUMIN 1.7*    BILITOT 4.1* 09/16/2014   ALKPHOS 497* 09/16/2014   AST 972* 09/16/2014   ALT 230* 09/16/2014  .   Total Time in preparing  paper work, data evaluation and todays exam - 35 minutes.death summary;more than 54 min  Mandalyn Pasqua M.D on  at 1:01 PM

## 2014-09-23 LAB — EXPECTORATED SPUTUM ASSESSMENT W GRAM STAIN, RFLX TO RESP C

## 2014-09-23 LAB — PH, BODY FLUID: PH, BODY FLUID: 6.9

## 2014-09-23 LAB — EXPECTORATED SPUTUM ASSESSMENT W REFEX TO RESP CULTURE

## 2014-09-25 DEATH — deceased

## 2014-10-27 NOTE — Progress Notes (Addendum)
Entered into chart for QI data

## 2014-10-29 LAB — ACID FAST SMEAR+CULTURE W/RFLX (ARMC ONLY): ACID FAST CULTURE: NEGATIVE

## 2016-03-06 IMAGING — CR DG CHEST 1V PORT
1 series · 1 of 1 positions shown · non-contrast
Comparison: September 16, 2014.

CLINICAL DATA: Acute respiratory failure.

EXAM:
PORTABLE CHEST - 1 VIEW

[ap]
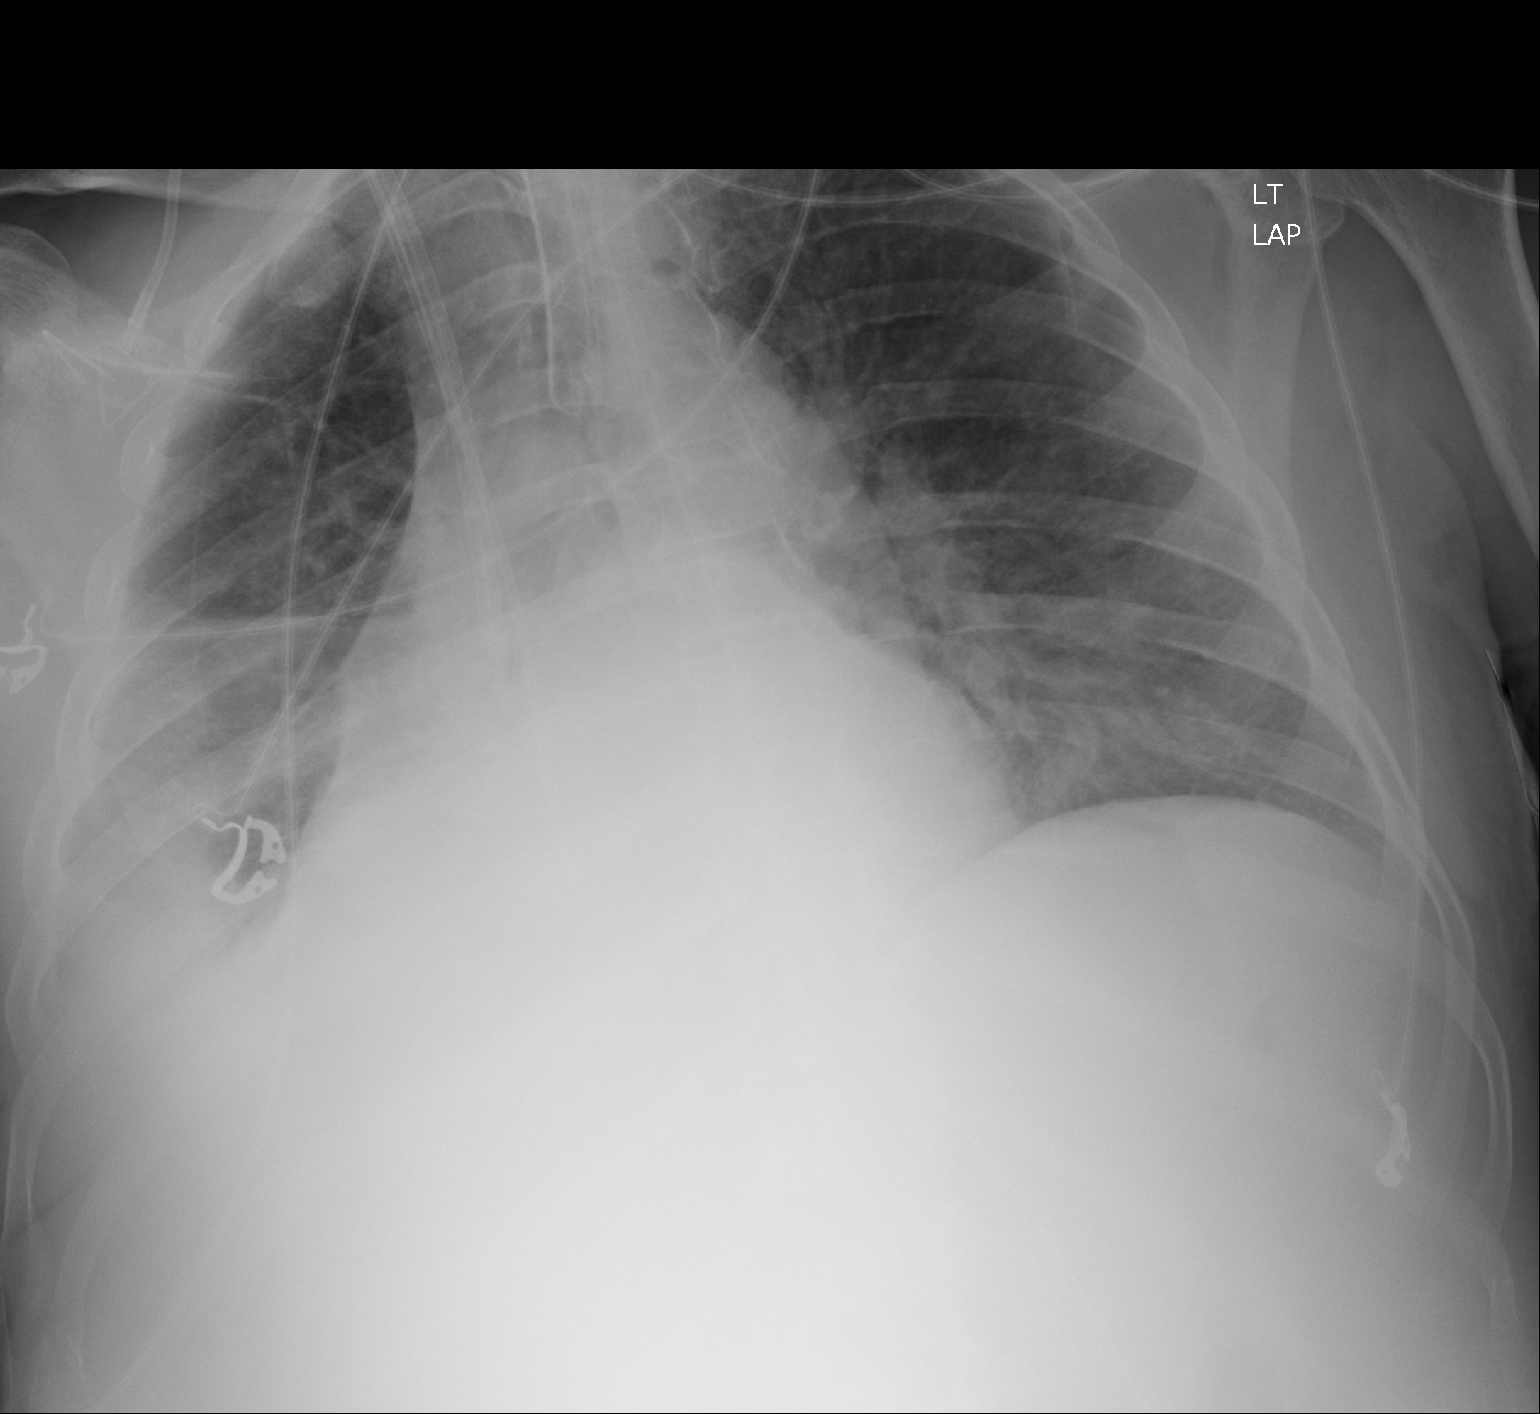

[1 of 1 positions shown; findings below may reference images not displayed]

FINDINGS: Stable cardiomediastinal silhouette. Endotracheal tube is in grossly
good position with distal tip 2.8 cm above the carina. Nasogastric
tube is seen entering the stomach. Right internal jugular catheter
line is noted with distal tip in expected position of right atrium.
Right internal jugular dialysis catheter is noted with distal tip in
expected position of cavoatrial junction. Mild central pulmonary
vascular congestion is noted. No pneumothorax is noted. Right-sided
pleural effusion is significantly smaller status post thoracentesis.
IMPRESSION: Right-sided pleural effusion is significantly smaller status post
thoracentesis. Endotracheal and nasogastric tubes in grossly good
position. Right-sided dialysis catheter is in grossly good position.
Right internal jugular catheter is noted with distal tip in right
atrium; withdrawal by 3-4 cm is recommended. These results will be
called to the ordering clinician or representative by the
Radiologist Assistant, and communication documented in the PACS or
[facility] Dashboard.
